# Patient Record
Sex: Male | Born: 1935 | Race: White | Hispanic: No | Marital: Married | Smoking: Never smoker
Health system: Southern US, Academic
[De-identification: ages and names within clinical notes are randomized; demographics above are authoritative.]

## PROBLEM LIST (undated history)

## (undated) ENCOUNTER — Inpatient Hospital Stay (HOSPITAL_COMMUNITY): Payer: Medicare PPO | Admitting: PULMONARY DISEASE

## (undated) DIAGNOSIS — D649 Anemia, unspecified: Secondary | ICD-10-CM

## (undated) DIAGNOSIS — I10 Essential (primary) hypertension: Secondary | ICD-10-CM

## (undated) DIAGNOSIS — E162 Hypoglycemia, unspecified: Secondary | ICD-10-CM

## (undated) DIAGNOSIS — E119 Type 2 diabetes mellitus without complications: Secondary | ICD-10-CM

## (undated) DIAGNOSIS — E785 Hyperlipidemia, unspecified: Secondary | ICD-10-CM

## (undated) DIAGNOSIS — I499 Cardiac arrhythmia, unspecified: Secondary | ICD-10-CM

## (undated) DIAGNOSIS — N183 Chronic kidney disease, stage 3 unspecified (CMS HCC): Secondary | ICD-10-CM

## (undated) DIAGNOSIS — I2699 Other pulmonary embolism without acute cor pulmonale: Secondary | ICD-10-CM

## (undated) DIAGNOSIS — I480 Paroxysmal atrial fibrillation: Secondary | ICD-10-CM

## (undated) DIAGNOSIS — R0989 Other specified symptoms and signs involving the circulatory and respiratory systems: Secondary | ICD-10-CM

## (undated) DIAGNOSIS — I255 Ischemic cardiomyopathy: Secondary | ICD-10-CM

## (undated) DIAGNOSIS — R0602 Shortness of breath: Secondary | ICD-10-CM

## (undated) DIAGNOSIS — I2510 Atherosclerotic heart disease of native coronary artery without angina pectoris: Secondary | ICD-10-CM

## (undated) DIAGNOSIS — I509 Heart failure, unspecified: Secondary | ICD-10-CM

## (undated) DIAGNOSIS — Z952 Presence of prosthetic heart valve: Secondary | ICD-10-CM

## (undated) DIAGNOSIS — I350 Nonrheumatic aortic (valve) stenosis: Secondary | ICD-10-CM

## (undated) HISTORY — DX: Hyperlipidemia, unspecified: E78.5

## (undated) HISTORY — DX: Ischemic cardiomyopathy: I25.5

## (undated) HISTORY — DX: Chronic kidney disease, stage 3 unspecified (CMS HCC): N18.30

## (undated) HISTORY — DX: Other pulmonary embolism without acute cor pulmonale (CMS HCC): I26.99

## (undated) HISTORY — DX: Heart failure, unspecified (CMS HCC): I50.9

## (undated) HISTORY — DX: Essential (primary) hypertension: I10

## (undated) HISTORY — PX: HX CORONARY STENT PLACEMENT: SHX49

## (undated) HISTORY — DX: Atherosclerotic heart disease of native coronary artery without angina pectoris: I25.10

## (undated) HISTORY — DX: Presence of prosthetic heart valve: Z95.2

## (undated) HISTORY — DX: Other specified symptoms and signs involving the circulatory and respiratory systems: R09.89

## (undated) HISTORY — DX: Paroxysmal atrial fibrillation (CMS HCC): I48.0

## (undated) HISTORY — DX: Nonrheumatic aortic (valve) stenosis: I35.0

## (undated) HISTORY — DX: Shortness of breath: R06.02

## (undated) HISTORY — PX: SKIN CANCER EXCISION: SHX779

---

## 2008-07-31 ENCOUNTER — Ambulatory Visit: Payer: Self-pay | Admitting: Gastroenterology

## 2009-10-23 ENCOUNTER — Ambulatory Visit (HOSPITAL_COMMUNITY): Payer: Self-pay | Admitting: PULMONARY DISEASE

## 2010-03-06 ENCOUNTER — Ambulatory Visit: Payer: Self-pay | Admitting: Internal Medicine

## 2010-03-13 ENCOUNTER — Ambulatory Visit: Payer: Self-pay | Admitting: Internal Medicine

## 2010-04-05 ENCOUNTER — Ambulatory Visit: Payer: Self-pay | Admitting: Internal Medicine

## 2011-01-08 ENCOUNTER — Ambulatory Visit: Payer: Self-pay | Admitting: Internal Medicine

## 2011-02-04 ENCOUNTER — Ambulatory Visit: Payer: Self-pay | Admitting: Internal Medicine

## 2011-05-14 ENCOUNTER — Ambulatory Visit: Payer: Self-pay | Admitting: Internal Medicine

## 2011-06-06 ENCOUNTER — Ambulatory Visit: Payer: Self-pay | Admitting: Internal Medicine

## 2011-09-10 ENCOUNTER — Ambulatory Visit: Payer: Self-pay | Admitting: Internal Medicine

## 2011-09-10 LAB — CBC CANCER CENTER
Basophil #: 0 x10 3/mm (ref 0.0–0.1)
Basophil %: 0.7 %
Eosinophil #: 0.1 x10 3/mm (ref 0.0–0.7)
HCT: 40.2 % (ref 40.0–52.0)
Lymphocyte #: 1.1 x10 3/mm (ref 1.0–3.6)
Lymphocyte %: 33.1 %
MCH: 31.4 pg (ref 26.0–34.0)
MCHC: 32.5 g/dL (ref 32.0–36.0)
Monocyte #: 0.5 x10 3/mm (ref 0.0–0.7)
Neutrophil #: 1.6 x10 3/mm (ref 1.4–6.5)
Neutrophil %: 49.4 %
RDW: 12.5 % (ref 11.5–14.5)
WBC: 3.3 x10 3/mm — ABNORMAL LOW (ref 3.8–10.6)

## 2011-10-05 ENCOUNTER — Ambulatory Visit: Payer: Self-pay | Admitting: Internal Medicine

## 2012-01-14 ENCOUNTER — Ambulatory Visit: Payer: Self-pay | Admitting: Internal Medicine

## 2012-01-14 LAB — FERRITIN: Ferritin (ARMC): 166 ng/mL (ref 8–388)

## 2012-01-14 LAB — CBC CANCER CENTER
Basophil %: 0.7 %
Eosinophil #: 0.1 x10 3/mm (ref 0.0–0.7)
HCT: 39 % — ABNORMAL LOW (ref 40.0–52.0)
HGB: 13 g/dL (ref 13.0–18.0)
Lymphocyte %: 31.9 %
MCH: 32.3 pg (ref 26.0–34.0)
MCHC: 33.3 g/dL (ref 32.0–36.0)
Monocyte #: 0.4 x10 3/mm (ref 0.2–1.0)
Monocyte %: 12.1 %
Neutrophil %: 52.2 %
Platelet: 150 x10 3/mm (ref 150–440)
RBC: 4.03 10*6/uL — ABNORMAL LOW (ref 4.40–5.90)

## 2012-01-14 LAB — RETICULOCYTES: Absolute Retic Count: 0.0445 10*6/uL (ref 0.024–0.084)

## 2012-02-04 ENCOUNTER — Ambulatory Visit: Payer: Self-pay | Admitting: Internal Medicine

## 2012-12-28 ENCOUNTER — Emergency Department: Payer: Self-pay | Admitting: Emergency Medicine

## 2012-12-28 LAB — BASIC METABOLIC PANEL
Anion Gap: 6 — ABNORMAL LOW (ref 7–16)
Co2: 26 mmol/L (ref 21–32)
EGFR (African American): 45 — ABNORMAL LOW
EGFR (Non-African Amer.): 39 — ABNORMAL LOW
Glucose: 120 mg/dL — ABNORMAL HIGH (ref 65–99)
Osmolality: 284 (ref 275–301)
Potassium: 4.4 mmol/L (ref 3.5–5.1)
Sodium: 141 mmol/L (ref 136–145)

## 2012-12-28 LAB — URINALYSIS, COMPLETE
Blood: NEGATIVE
Ketone: NEGATIVE
Leukocyte Esterase: NEGATIVE
Nitrite: NEGATIVE
Ph: 5 (ref 4.5–8.0)
RBC,UR: <1 /HPF (ref 0–5)
Specific Gravity: 1.015 (ref 1.003–1.030)
Squamous Epithelial: NONE SEEN

## 2012-12-28 LAB — CBC
HGB: 12.7 g/dL — ABNORMAL LOW (ref 13.0–18.0)
MCH: 32.4 pg (ref 26.0–34.0)
MCHC: 33.9 g/dL (ref 32.0–36.0)
MCV: 96 fL (ref 80–100)
RBC: 3.92 10*6/uL — ABNORMAL LOW (ref 4.40–5.90)
RDW: 13.3 % (ref 11.5–14.5)

## 2012-12-28 LAB — TROPONIN I: Troponin-I: <0.02 ng/mL

## 2013-02-07 ENCOUNTER — Ambulatory Visit: Payer: Self-pay | Admitting: Nephrology

## 2013-08-26 IMAGING — CT CT ABDOMEN W/O CM
1 of 2 series · 14 of 32 positions shown, 18 images · non-contrast
Comparison: none

REASON FOR EXAM: cyst  LT renal mass
COMMENTS:

[Series 2: soft tissue · axial · 0.78mm/px · z∈[-371,-116]mm · 14 of 95 slices shown, 18 images]
[im 5/95  soft-tissue]
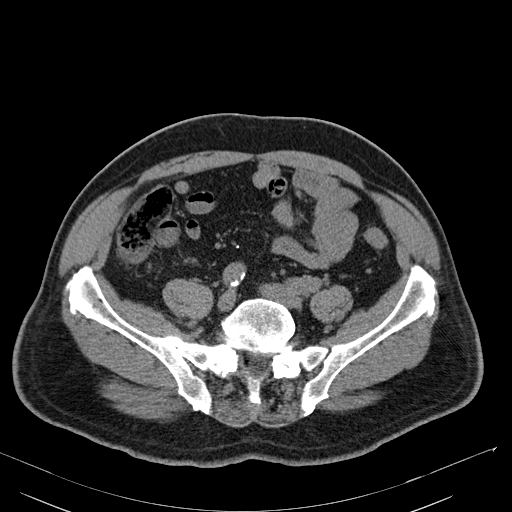
[im 5/95  bone]
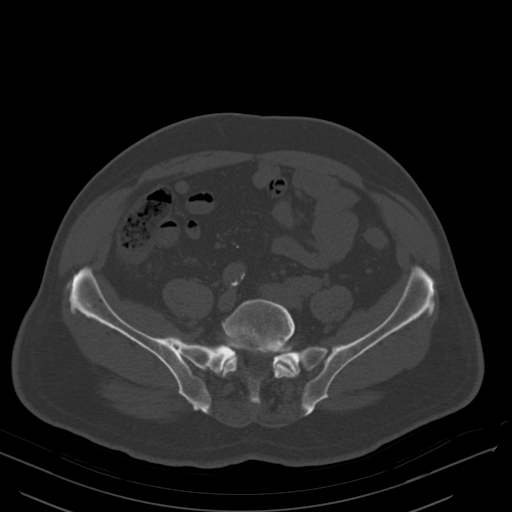
[im 13/95  soft-tissue]
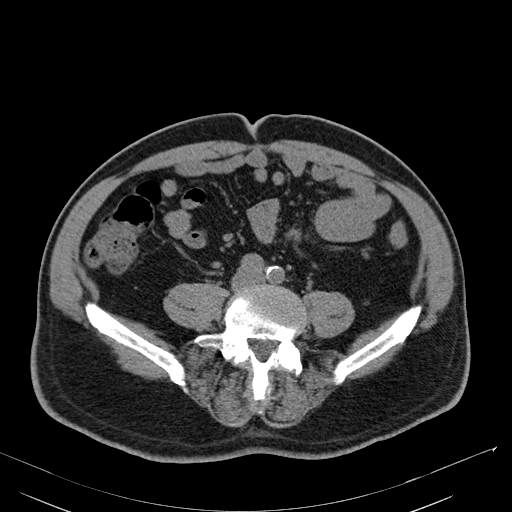
[im 22/95  soft-tissue]
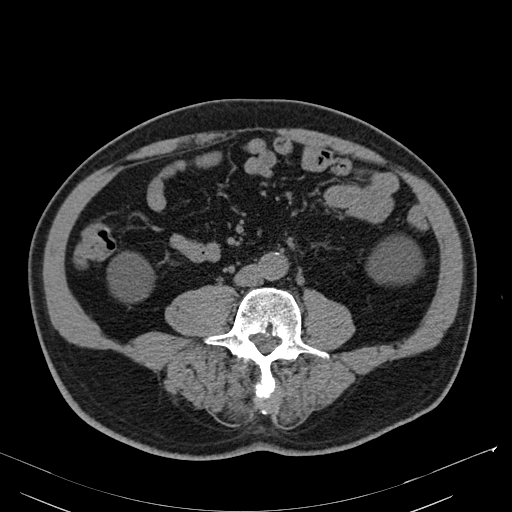
[im 30/95  soft-tissue]
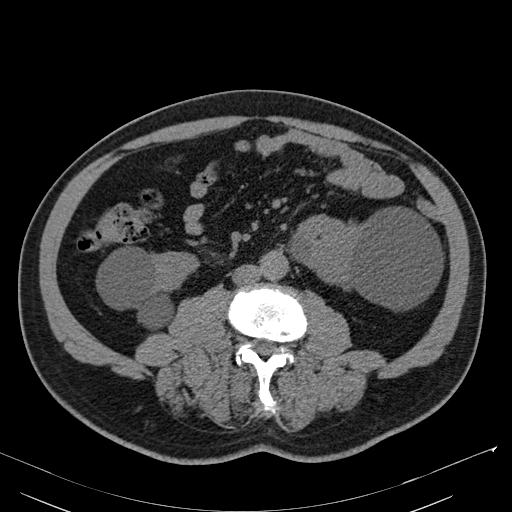
[im 35/95  soft-tissue]
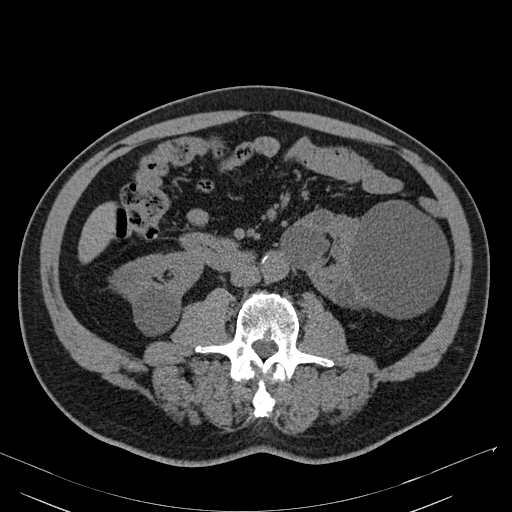
[im 43/95  soft-tissue]
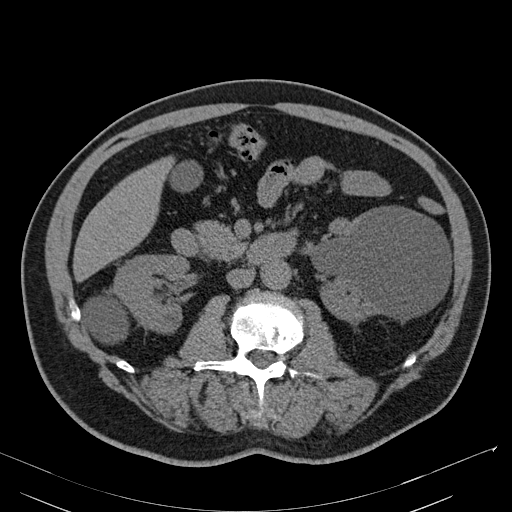
[im 52/95  soft-tissue]
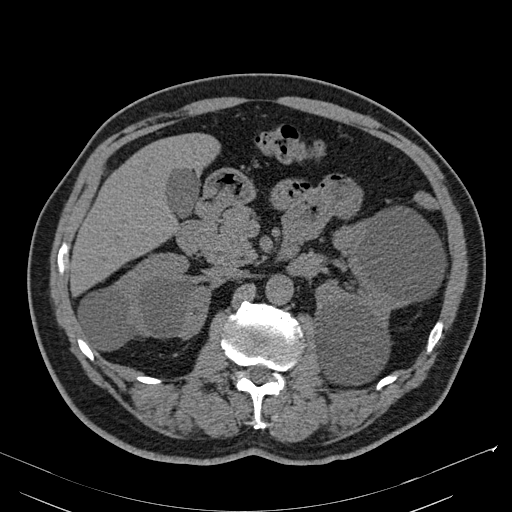
[im 60/95  soft-tissue]
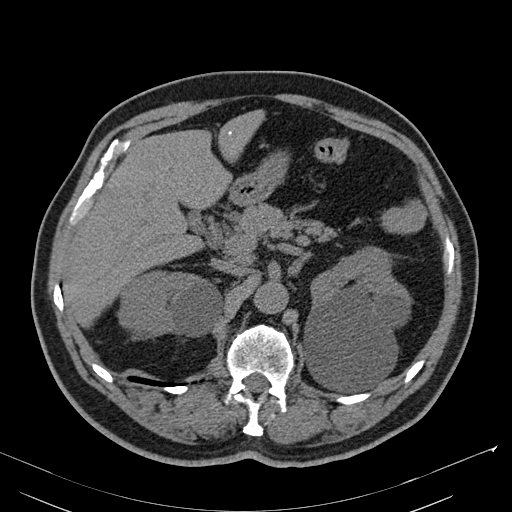
[im 65/95  soft-tissue]
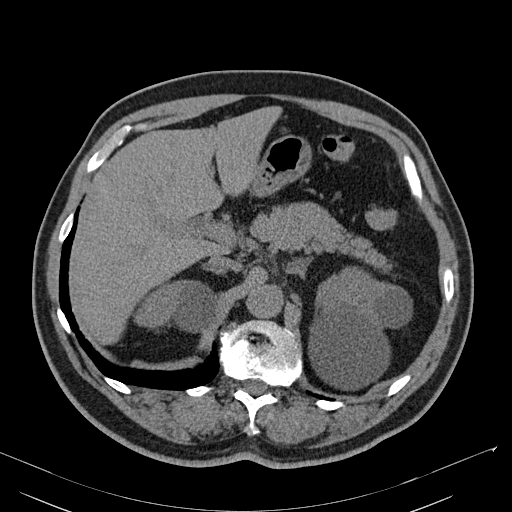
[im 65/95  bone]
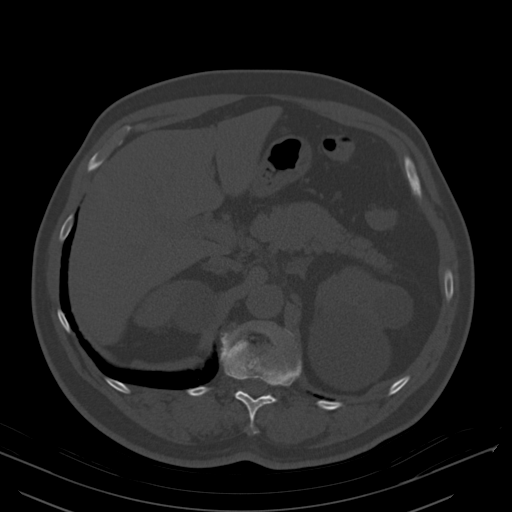
[im 73/95  soft-tissue]
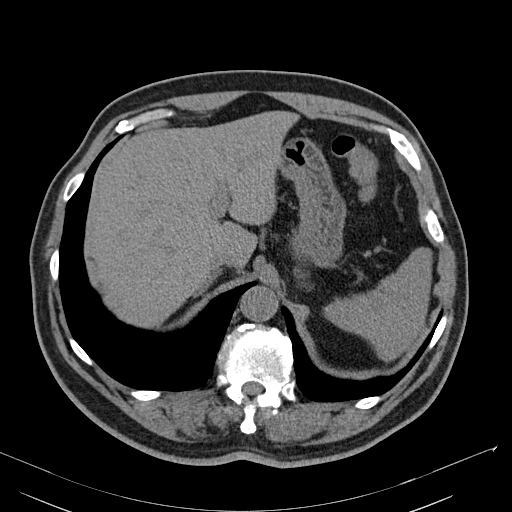
[im 77/95  lung]
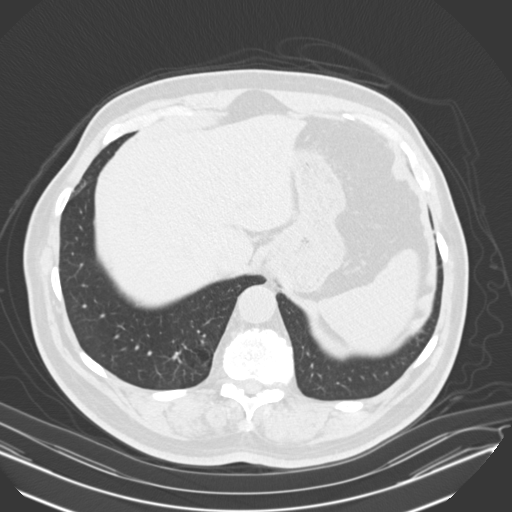
[im 82/95  soft-tissue]
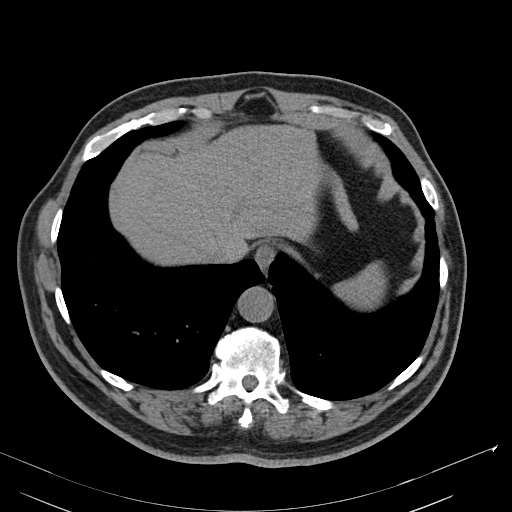
[im 82/95  lung]
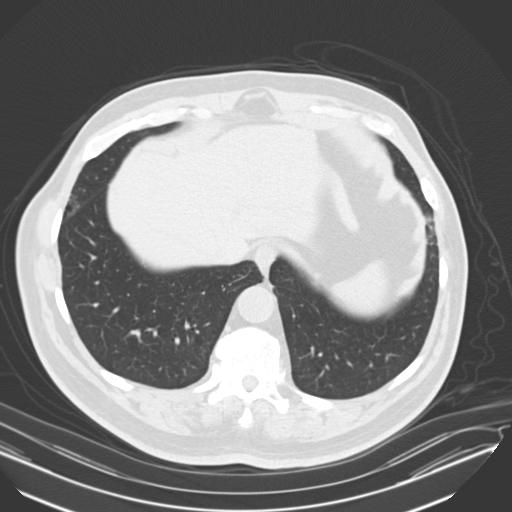
[im 86/95  lung]
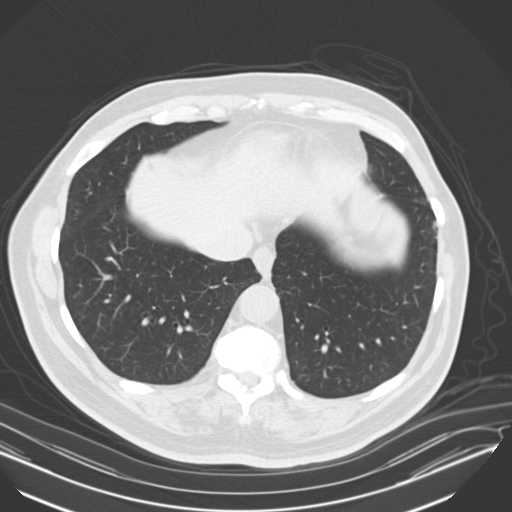
[im 90/95  soft-tissue]
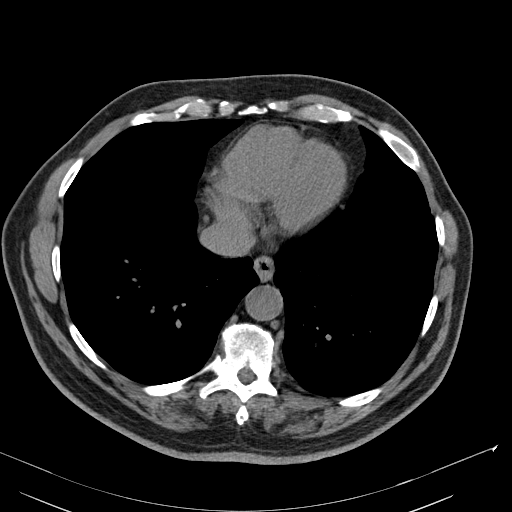
[im 90/95  lung]
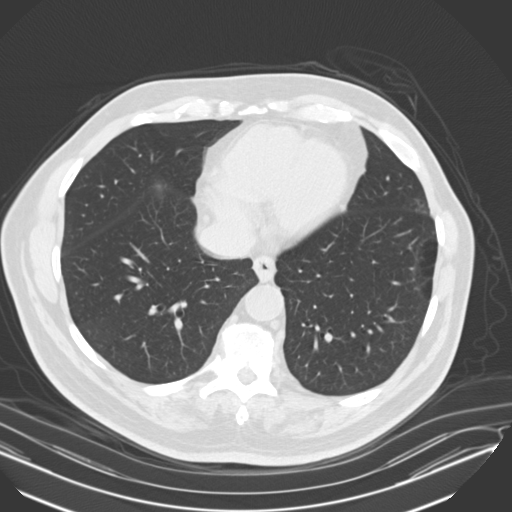

[14 of 32 positions shown; findings below may reference images not displayed]

PROCEDURE:     SEBAKILE - SEBAKILE ABDOMEN STANDARD WO  - February 07, 2013  [DATE]

RESULT:     Axial noncontrast CT scanning was performed through the abdomen
with reconstructions at 3 mm intervals and slice thicknesses. Review of
multiplanar reconstructed images was performed separately on the VIA monitor.

There are multiple fluid density structures associated with both kidneys.
These have Hounsfield measurements ranging from -2 to +3. No rim
calcification is demonstrated.

In the upper pole of the right kidney there is a 4.3 maximal dimension
presumed cyst. In the midpole laterally there is a 4.7 cm diameter cyst. In
the parapelvic region of the midpole there is a 4.6 cm diameter cyst. In the
lower pole posteriorly there is a 3.5 cm diameter cyst. More inferiorly in
the lower pole laterally there is a 5.2 cm diameter cystic-appearing
structure.

On the left in the upper pole posteriorly there is a presumed cyst measuring
8.3 cm in diameter. An adjacent 3.4 cm diameter similar appearing
hypodensity is demonstrated. In the midpole there is a 1.7 centimeter
diameter cystic structure as well as a 10.9 cm diameter cystic structure. In
the mid to lower pole anteriorly and posteriorly there are 2 cystic
structures measuring 1.1 and 1.8 cm in diameter. More medially a cystic
structure measuring 3.2 cm in diameter is present. Neither kidney exhibits
evidence of stones. No abnormal perinephric fluid collections are
demonstrated.

The liver and pancreas exhibit normal density with no cystic change. The
gallbladder is adequately distended. I cannot exclude tiny faintly
radiodense stones. The spleen correction the nondistended stomach is normal
in appearance. There is very mild fullness of the left adrenal gland. The
caliber of the abdominal aorta is normal. The unopacified loops of small and
large bowel are normal where visualized.

The lung bases exhibit no infiltrates. There are bullous lesions
bilaterally. There is no pleural effusion.
IMPRESSION: 1. There are bilateral parenchymal hypodensities associated with both
kidneys compatible with cysts. The largest cystic structure on the right
measures approximately 5 cm and on the left approximately 10.9 cm.
Correlation with the outside ultrasound would be useful.
2. I cannot exclude tiny gallstones.
3. There emphysematous changes with bullous lesions at both lung bases.

[REDACTED]

## 2014-01-02 DIAGNOSIS — M542 Cervicalgia: Secondary | ICD-10-CM | POA: Insufficient documentation

## 2014-01-02 DIAGNOSIS — I10 Essential (primary) hypertension: Secondary | ICD-10-CM | POA: Insufficient documentation

## 2014-01-02 DIAGNOSIS — E7800 Pure hypercholesterolemia, unspecified: Secondary | ICD-10-CM | POA: Insufficient documentation

## 2014-01-02 DIAGNOSIS — D649 Anemia, unspecified: Secondary | ICD-10-CM | POA: Insufficient documentation

## 2014-01-11 ENCOUNTER — Ambulatory Visit: Payer: Self-pay | Admitting: Emergency Medicine

## 2014-01-25 ENCOUNTER — Emergency Department: Payer: Self-pay | Admitting: Emergency Medicine

## 2014-01-25 LAB — COMPREHENSIVE METABOLIC PANEL
ALBUMIN: 4.1 g/dL (ref 3.4–5.0)
ALK PHOS: 55 U/L
AST: 19 U/L (ref 15–37)
Anion Gap: 6 — ABNORMAL LOW (ref 7–16)
BILIRUBIN TOTAL: 0.4 mg/dL (ref 0.2–1.0)
BUN: 15 mg/dL (ref 7–18)
Calcium, Total: 8.9 mg/dL (ref 8.5–10.1)
Chloride: 105 mmol/L (ref 98–107)
Co2: 28 mmol/L (ref 21–32)
Creatinine: 1.36 mg/dL — ABNORMAL HIGH (ref 0.60–1.30)
EGFR (African American): 56 — ABNORMAL LOW
GFR CALC NON AF AMER: 48 — AB
GLUCOSE: 94 mg/dL (ref 65–99)
Osmolality: 278 (ref 275–301)
POTASSIUM: 4.5 mmol/L (ref 3.5–5.1)
SGPT (ALT): 29 U/L
Sodium: 139 mmol/L (ref 136–145)
TOTAL PROTEIN: 7.7 g/dL (ref 6.4–8.2)

## 2014-01-25 LAB — URINALYSIS, COMPLETE
BACTERIA: NONE SEEN
Bilirubin,UR: NEGATIVE
GLUCOSE, UR: NEGATIVE mg/dL (ref 0–75)
Ketone: NEGATIVE
Leukocyte Esterase: NEGATIVE
NITRITE: NEGATIVE
PH: 5 (ref 4.5–8.0)
Protein: NEGATIVE
RBC, UR: NONE SEEN /HPF (ref 0–5)
SPECIFIC GRAVITY: 1.004 (ref 1.003–1.030)
Squamous Epithelial: NONE SEEN
WBC UR: <1 /HPF (ref 0–5)

## 2014-01-25 LAB — CBC
HCT: 40.5 % (ref 40.0–52.0)
HGB: 13.5 g/dL (ref 13.0–18.0)
MCH: 32.3 pg (ref 26.0–34.0)
MCHC: 33.3 g/dL (ref 32.0–36.0)
MCV: 97 fL (ref 80–100)
PLATELETS: 172 10*3/uL (ref 150–440)
RBC: 4.18 10*6/uL — ABNORMAL LOW (ref 4.40–5.90)
RDW: 12.6 % (ref 11.5–14.5)
WBC: 3.6 10*3/uL — AB (ref 3.8–10.6)

## 2014-01-25 LAB — CK TOTAL AND CKMB (NOT AT ARMC)
CK, TOTAL: 197 U/L
CK-MB: 1.8 ng/mL (ref 0.5–3.6)

## 2014-01-25 LAB — TROPONIN I

## 2014-02-01 DIAGNOSIS — E11649 Type 2 diabetes mellitus with hypoglycemia without coma: Secondary | ICD-10-CM | POA: Insufficient documentation

## 2014-02-01 DIAGNOSIS — R809 Proteinuria, unspecified: Secondary | ICD-10-CM | POA: Insufficient documentation

## 2014-05-01 ENCOUNTER — Ambulatory Visit: Payer: Self-pay | Admitting: Nephrology

## 2014-05-10 DIAGNOSIS — E559 Vitamin D deficiency, unspecified: Secondary | ICD-10-CM | POA: Insufficient documentation

## 2014-05-17 ENCOUNTER — Ambulatory Visit: Payer: Self-pay | Admitting: Nephrology

## 2014-05-29 ENCOUNTER — Ambulatory Visit: Payer: Self-pay | Admitting: Family Medicine

## 2015-03-01 ENCOUNTER — Encounter: Payer: Self-pay | Admitting: *Deleted

## 2015-03-04 ENCOUNTER — Encounter: Payer: Self-pay | Admitting: *Deleted

## 2015-03-04 ENCOUNTER — Ambulatory Visit
Admission: RE | Admit: 2015-03-04 | Discharge: 2015-03-04 | Disposition: A | Discharge status: Home Health | Payer: Medicare Other | Source: Ambulatory Visit | Attending: Gastroenterology | Admitting: Gastroenterology

## 2015-03-04 ENCOUNTER — Encounter
Admission: RE | Disposition: A | Discharge status: Home Health | Payer: Self-pay | Source: Ambulatory Visit | Attending: Gastroenterology

## 2015-03-04 ENCOUNTER — Ambulatory Visit: Payer: Medicare Other | Admitting: Anesthesiology

## 2015-03-04 DIAGNOSIS — K648 Other hemorrhoids: Secondary | ICD-10-CM | POA: Insufficient documentation

## 2015-03-04 DIAGNOSIS — Z87891 Personal history of nicotine dependence: Secondary | ICD-10-CM | POA: Diagnosis not present

## 2015-03-04 DIAGNOSIS — E785 Hyperlipidemia, unspecified: Secondary | ICD-10-CM | POA: Insufficient documentation

## 2015-03-04 DIAGNOSIS — D649 Anemia, unspecified: Secondary | ICD-10-CM | POA: Insufficient documentation

## 2015-03-04 DIAGNOSIS — K208 Other esophagitis: Secondary | ICD-10-CM | POA: Diagnosis not present

## 2015-03-04 DIAGNOSIS — D125 Benign neoplasm of sigmoid colon: Secondary | ICD-10-CM | POA: Insufficient documentation

## 2015-03-04 DIAGNOSIS — I10 Essential (primary) hypertension: Secondary | ICD-10-CM | POA: Diagnosis not present

## 2015-03-04 DIAGNOSIS — R195 Other fecal abnormalities: Secondary | ICD-10-CM | POA: Diagnosis present

## 2015-03-04 DIAGNOSIS — K2950 Unspecified chronic gastritis without bleeding: Secondary | ICD-10-CM | POA: Diagnosis not present

## 2015-03-04 DIAGNOSIS — K5900 Constipation, unspecified: Secondary | ICD-10-CM | POA: Diagnosis not present

## 2015-03-04 DIAGNOSIS — E119 Type 2 diabetes mellitus without complications: Secondary | ICD-10-CM | POA: Diagnosis not present

## 2015-03-04 HISTORY — DX: Type 2 diabetes mellitus without complications: E11.9

## 2015-03-04 HISTORY — PX: COLONOSCOPY WITH PROPOFOL: SHX5780

## 2015-03-04 HISTORY — PX: ESOPHAGOGASTRODUODENOSCOPY (EGD) WITH PROPOFOL: SHX5813

## 2015-03-04 HISTORY — DX: Hyperlipidemia, unspecified: E78.5

## 2015-03-04 HISTORY — DX: Essential (primary) hypertension: I10

## 2015-03-04 HISTORY — DX: Hypoglycemia, unspecified: E16.2

## 2015-03-04 HISTORY — DX: Anemia, unspecified: D64.9

## 2015-03-04 LAB — GLUCOSE, CAPILLARY: Glucose-Capillary: 106 mg/dL — ABNORMAL HIGH (ref 65–99)

## 2015-03-04 SURGERY — COLONOSCOPY WITH PROPOFOL
Anesthesia: General

## 2015-03-04 MED ORDER — SODIUM CHLORIDE 0.9 % IV SOLN
INTRAVENOUS | Status: DC
  Administered 2015-03-04: 16:00:00 via INTRAVENOUS

## 2015-03-04 MED ORDER — PHENYLEPHRINE HCL 10 MG/ML IJ SOLN
INTRAMUSCULAR | Status: DC | PRN
  Administered 2015-03-04 (×3): 100 ug via INTRAVENOUS

## 2015-03-04 MED ORDER — SODIUM CHLORIDE 0.9 % IV SOLN: INTRAVENOUS | Status: DC

## 2015-03-04 MED ORDER — MIDAZOLAM HCL 2 MG/2ML IJ SOLN
INTRAMUSCULAR | Status: DC | PRN
  Administered 2015-03-04: 1 mg via INTRAVENOUS

## 2015-03-04 MED ORDER — PROPOFOL INFUSION 10 MG/ML OPTIME
INTRAVENOUS | Status: DC | PRN
  Administered 2015-03-04: 120 ug/kg/min via INTRAVENOUS

## 2015-03-04 MED ORDER — GLYCOPYRROLATE 0.2 MG/ML IJ SOLN
INTRAMUSCULAR | Status: DC | PRN
  Administered 2015-03-04: .2 mg via INTRAVENOUS

## 2015-03-04 MED ORDER — EPHEDRINE SULFATE 50 MG/ML IJ SOLN
INTRAMUSCULAR | Status: DC | PRN
  Administered 2015-03-04: 10 mg via INTRAVENOUS

## 2015-03-04 MED ORDER — LIDOCAINE HCL (CARDIAC) 20 MG/ML IV SOLN
INTRAVENOUS | Status: DC | PRN
  Administered 2015-03-04: 100 mg via INTRAVENOUS

## 2015-03-04 MED ORDER — PROPOFOL 10 MG/ML IV BOLUS
INTRAVENOUS | Status: DC | PRN
  Administered 2015-03-04: 40 mg via INTRAVENOUS

## 2015-03-04 MED ORDER — SODIUM CHLORIDE 0.9 % IV SOLN
INTRAVENOUS | Status: DC
  Administered 2015-03-04: 14:00:00 via INTRAVENOUS

## 2015-03-04 NOTE — Transfer of Care (Signed)
Immediate Anesthesia Transfer of Care Note  Patient: Nathaniel Villanueva  Procedure(s) Performed: Procedure(s): COLONOSCOPY WITH PROPOFOL (N/A) ESOPHAGOGASTRODUODENOSCOPY (EGD) WITH PROPOFOL (N/A)  Patient Location: Endoscopy Unit  Anesthesia Type:General  Level of Consciousness: awake, alert , oriented and patient cooperative  Airway & Oxygen Therapy: Patient Spontanous Breathing and Patient connected to nasal cannula oxygen  Post-op Assessment: Report given to RN, Post -op Vital signs reviewed and stable and Patient moving all extremities X 4  Post vital signs: Reviewed and stable  Last Vitals:  Filed Vitals:   03/04/15 1636  BP: 110/73  Pulse: 66  Temp: 36.4 C  Resp: 16    Complications: No apparent anesthesia complications

## 2015-03-04 NOTE — Op Note (Signed)
[facility] Gastroenterology Patient Name: Nathaniel Villanueva Procedure Date: 03/04/2015 3:44 PM MRN: [id number] Account #: [id number] Date of Birth: [birth year] Admit Type: Outpatient Age: 81 Room: [facility] ENDO ROOM 2 Gender: Male Note Status: Finalized Procedure:         Colonoscopy Indications:       Heme positive stool Providers:         Lollie Sails, MD Referring MD:      Shirline Frees (Referring MD) Medicines:         Monitored Anesthesia Care Complications:     No immediate complications. Procedure:         Pre-Anesthesia Assessment:                    - ASA Grade Assessment: II - A patient with mild systemic                     disease.                    After obtaining informed consent, the colonoscope was                     passed under direct vision. Throughout the procedure, the                     patient's blood pressure, pulse, and oxygen saturations                     were monitored continuously. The Olympus PCF-H180AL                     colonoscope ( S#: [id number] ) was introduced through the                     anus and advanced to the the cecum, identified by                     appendiceal orifice and ileocecal valve. The colonoscopy                     was performed without difficulty. The patient tolerated                     the procedure well. The quality of the bowel preparation                     was fair. Findings:      A 1 mm polyp was found in the mid sigmoid colon. The polyp was sessile.       The polyp was removed with a cold biopsy forceps. Resection and       retrieval were complete.      Non-bleeding internal hemorrhoids were found during retroflexion. The       hemorrhoids were small.      The exam was otherwise without abnormality. Impression:        - One 1 mm polyp in the mid sigmoid colon. Resected and                     retrieved.                    - Non-bleeding internal hemorrhoids. Recommendation:    - Await  pathology results. Procedure Code(s): --- Professional ---  45380, Colonoscopy, flexible; with biopsy, single or                     multiple Diagnosis Code(s): --- Professional ---                    211.3, Benign neoplasm of colon                    455.0, Internal hemorrhoids without mention of complication                    792.1, Nonspecific abnormal findings in stool contents CPT copyright 2014 American [employer] Association. All rights reserved. The codes documented in this report are preliminary and upon coder review may  be revised to meet current compliance requirements. Lollie Sails, MD 03/04/2015 4:30:53 PM This report has been signed electronically. Number of Addenda: 0 Note Initiated On: 03/04/2015 3:44 PM Scope Withdrawal Time: 0 hours 11 minutes 49 seconds  Total Procedure Duration: 0 hours 19 minutes 4 seconds       [facility]

## 2015-03-04 NOTE — Anesthesia Preprocedure Evaluation (Signed)
Anesthesia Evaluation  Patient identified by MRN, date of birth, ID band Patient awake    Reviewed: Allergy & Precautions, NPO status , Patient's Chart, lab work & pertinent test results, reviewed documented beta blocker date and time   Airway Mallampati: III  TM Distance: >3 FB     Dental  (+) Chipped   Pulmonary former smoker,          Cardiovascular hypertension, Pt. on medications and Pt. on home beta blockers     Neuro/Psych    GI/Hepatic   Endo/Other  diabetes, Type 2  Renal/GU      Musculoskeletal   Abdominal   Peds  Hematology  (+) anemia ,   Anesthesia Other Findings   Reproductive/Obstetrics                             Anesthesia Physical Anesthesia Plan  ASA: II  Anesthesia Plan: General   Post-op Pain Management:    Induction:   Airway Management Planned: Nasal Cannula  Additional Equipment:   Intra-op Plan:   Post-operative Plan:   Informed Consent: I have reviewed the patients History and Physical, chart, labs and discussed the procedure including the risks, benefits and alternatives for the proposed anesthesia with the patient or authorized representative who has indicated his/her understanding and acceptance.     Plan Discussed with: CRNA  Anesthesia Plan Comments:         Anesthesia Quick Evaluation

## 2015-03-04 NOTE — H&P (Signed)
Outpatient short stay form Pre-procedure 03/04/2015 3:31 PM Lollie Sails MD  Primary Physician: Dr Sherrin Daisy  Reason for visit:  EGD and colonoscopy  History of present illness:  Hemoccult-positive stool. Patient is a 81 year old male presenting with a finding of Hemoccult-positive stool and screening. He had noticed a very dark stool several weeks ago and was checked by his primary physician. He has noted some change of bowel habits with some increasing constipation necessitating the use of a Dulcolax tablet.  Last colonoscopy was in 2010. He takes no anticoagulation medications were aspirin products    Current facility-administered medications:  .  0.9 %  sodium chloride infusion, , Intravenous, Continuous, Lollie Sails, MD, Last Rate: 10 mL/hr at 03/04/15 1405 .  0.9 %  sodium chloride infusion, , Intravenous, Continuous, Lollie Sails, MD  Prescriptions prior to admission  Medication Sig Dispense Refill Last Dose  . amLODipine (NORVASC) 10 MG tablet Take 10 mg by mouth daily.   Past Week at Unknown time  . doxazosin (CARDURA) 4 MG tablet Take 4 mg by mouth daily.   Past Week at Unknown time  . ergocalciferol (VITAMIN D2) 50000 UNITS capsule Take 50,000 Units by mouth once a week.   Past Month at Unknown time  . lisinopril (PRINIVIL,ZESTRIL) 20 MG tablet Take 20 mg by mouth daily.   03/03/2015 at Unknown time  . metFORMIN (GLUCOPHAGE) 500 MG tablet Take 500 mg by mouth 2 (two) times daily with a meal.   Past Month at Unknown time  . metoprolol succinate (TOPROL-XL) 100 MG 24 hr tablet Take 100 mg by mouth daily. Take with or immediately following a meal.   03/04/2015 at 0600     No Known Allergies   Past Medical History  Diagnosis Date  . Hypertension   . Diabetes mellitus without complication   . Hyperlipidemia   . Anemia   . Hypoglycemia     Review of systems:      Physical Exam    Heart and lungs: Regular rate and rhythm without rub or gallop, lungs  are bilaterally clear    HEENT: Normocephalic atraumatic eyes are anicteric    Other:     Pertinant exam for procedure: Soft nontender nondistended bowel sounds positive normoactive    Planned proceedures: EGD and colonoscopy with indicated procedures. I have discussed the risks benefits and complications of procedures to include not limited to bleeding, infection, perforation and the risk of sedation and the patient wishes to proceed.    Lollie Sails, MD Gastroenterology 03/04/2015  3:31 PM

## 2015-03-04 NOTE — Op Note (Signed)
[facility] Gastroenterology Patient Name: Nathaniel Villanueva Procedure Date: 03/04/2015 3:45 PM MRN: [id number] Account #: [id number] Date of Birth: January 12, 1931 Admit Type: Outpatient Age: 81 Room: [facility] ENDO ROOM 2 Gender: Male Note Status: Finalized Procedure:         Upper GI endoscopy Indications:       Heme positive stool Providers:         Lollie Sails, MD Referring MD:      Shirline Frees (Referring MD) Medicines:         Monitored Anesthesia Care Complications:     No immediate complications. Procedure:         Pre-Anesthesia Assessment:                    - ASA Grade Assessment: III - A patient with severe                     systemic disease.                    After obtaining informed consent, the endoscope was passed                     under direct vision. Throughout the procedure, the                     patient's blood pressure, pulse, and oxygen saturations                     were monitored continuously. The Endoscope was introduced                     through the mouth, and advanced to the fourth part of                     duodenum. The upper GI endoscopy was accomplished without                     difficulty. The patient tolerated the procedure well. Findings:      LA Grade C (one or more mucosal breaks continuous between tops of 2 or       more mucosal folds, less than 75% circumference) esophagitis with no       bleeding was found. Biopsies were taken with a cold forceps for       histology.      The exam of the esophagus was otherwise normal.      Patchy mild inflammation characterized by erosions marked by hematin       material, erythema and granularity was found in the gastric body, at the       incisura and in the gastric antrum. Biopsies were taken with a cold       forceps for Helicobacter pylori testing. Biopsies were taken with a cold       forceps for histology.      The cardia and gastric fundus were normal on retroflexion.     The examined duodenum was normal. Impression:        - LA Grade C erosive esophagitis. Biopsied.                    - Gastritis. Biopsied.                    - Normal examined duodenum. Recommendation:    - Await pathology results.                    -  Use Protonix (pantoprazole) 40 mg PO daily daily.                    - Return to GI clinic in 6 weeks. Procedure Code(s): --- Professional ---                    [phone], Esophagogastroduodenoscopy, flexible, transoral;                     with biopsy, single or multiple Diagnosis Code(s): --- Professional ---                    530.19, Other esophagitis                    535.50, Unspecified gastritis and gastroduodenitis,                     without mention of hemorrhage                    792.1, Nonspecific abnormal findings in stool contents CPT copyright 2014 American Medical Association. All rights reserved. The codes documented in this report are preliminary and upon coder review may  be revised to meet current compliance requirements. Lollie Sails, MD 03/04/2015 4:05:59 PM This report has been signed electronically. Number of Addenda: 0 Note Initiated On: 03/04/2015 3:45 PM      [facility]

## 2015-03-05 ENCOUNTER — Encounter: Payer: Self-pay | Admitting: Gastroenterology

## 2015-03-06 LAB — SURGICAL PATHOLOGY

## 2015-03-06 NOTE — Anesthesia Postprocedure Evaluation (Signed)
  Anesthesia Post-op Note  Patient: Nathaniel Villanueva  Procedure(s) Performed: Procedure(s): COLONOSCOPY WITH PROPOFOL (N/A) ESOPHAGOGASTRODUODENOSCOPY (EGD) WITH PROPOFOL (N/A)  Anesthesia type:General  Patient location: PACU  Post pain: Pain level controlled  Post assessment: Post-op Vital signs reviewed, Patient's Cardiovascular Status Stable, Respiratory Function Stable, Patent Airway and No signs of Nausea or vomiting  Post vital signs: Reviewed and stable  Last Vitals:  Filed Vitals:   03/04/15 1700  BP: 127/87  Pulse: 67  Temp:   Resp: 18    Level of consciousness: awake, alert  and patient cooperative  Complications: No apparent anesthesia complications

## 2015-04-05 ENCOUNTER — Encounter: Payer: Self-pay | Admitting: General Practice

## 2015-04-05 ENCOUNTER — Observation Stay
Admit: 2015-04-05 | Discharge: 2015-04-05 | Disposition: A | Discharge status: Home / Self Care | Payer: Medicare Other | Attending: Internal Medicine | Admitting: Internal Medicine

## 2015-04-05 ENCOUNTER — Emergency Department: Payer: Medicare Other

## 2015-04-05 ENCOUNTER — Observation Stay
Admission: EM | Admit: 2015-04-05 | Discharge: 2015-04-07 | Disposition: A | Discharge status: Home / Self Care | Payer: Medicare Other | Attending: Internal Medicine | Admitting: Internal Medicine

## 2015-04-05 DIAGNOSIS — Z23 Encounter for immunization: Secondary | ICD-10-CM | POA: Diagnosis not present

## 2015-04-05 DIAGNOSIS — I10 Essential (primary) hypertension: Secondary | ICD-10-CM | POA: Insufficient documentation

## 2015-04-05 DIAGNOSIS — M25512 Pain in left shoulder: Secondary | ICD-10-CM | POA: Diagnosis not present

## 2015-04-05 DIAGNOSIS — K219 Gastro-esophageal reflux disease without esophagitis: Secondary | ICD-10-CM | POA: Insufficient documentation

## 2015-04-05 DIAGNOSIS — M25511 Pain in right shoulder: Secondary | ICD-10-CM | POA: Diagnosis not present

## 2015-04-05 DIAGNOSIS — M5134 Other intervertebral disc degeneration, thoracic region: Secondary | ICD-10-CM | POA: Insufficient documentation

## 2015-04-05 DIAGNOSIS — E785 Hyperlipidemia, unspecified: Secondary | ICD-10-CM | POA: Insufficient documentation

## 2015-04-05 DIAGNOSIS — W19XXXA Unspecified fall, initial encounter: Secondary | ICD-10-CM | POA: Diagnosis not present

## 2015-04-05 DIAGNOSIS — R55 Syncope and collapse: Principal | ICD-10-CM | POA: Diagnosis present

## 2015-04-05 DIAGNOSIS — S022XXB Fracture of nasal bones, initial encounter for open fracture: Secondary | ICD-10-CM | POA: Diagnosis present

## 2015-04-05 DIAGNOSIS — S022XXA Fracture of nasal bones, initial encounter for closed fracture: Secondary | ICD-10-CM | POA: Diagnosis not present

## 2015-04-05 DIAGNOSIS — D649 Anemia, unspecified: Secondary | ICD-10-CM | POA: Diagnosis not present

## 2015-04-05 DIAGNOSIS — R509 Fever, unspecified: Secondary | ICD-10-CM | POA: Insufficient documentation

## 2015-04-05 DIAGNOSIS — Z87891 Personal history of nicotine dependence: Secondary | ICD-10-CM | POA: Diagnosis not present

## 2015-04-05 DIAGNOSIS — E119 Type 2 diabetes mellitus without complications: Secondary | ICD-10-CM | POA: Insufficient documentation

## 2015-04-05 DIAGNOSIS — S0121XA Laceration without foreign body of nose, initial encounter: Secondary | ICD-10-CM | POA: Diagnosis not present

## 2015-04-05 DIAGNOSIS — Z79899 Other long term (current) drug therapy: Secondary | ICD-10-CM | POA: Diagnosis not present

## 2015-04-05 DIAGNOSIS — M5030 Other cervical disc degeneration, unspecified cervical region: Secondary | ICD-10-CM | POA: Diagnosis not present

## 2015-04-05 LAB — GLUCOSE, CAPILLARY
GLUCOSE-CAPILLARY: 170 mg/dL — AB (ref 65–99)
Glucose-Capillary: 112 mg/dL — ABNORMAL HIGH (ref 65–99)

## 2015-04-05 LAB — CBC
HCT: 38.2 % — ABNORMAL LOW (ref 40.0–52.0)
HEMOGLOBIN: 12.6 g/dL — AB (ref 13.0–18.0)
MCH: 31.7 pg (ref 26.0–34.0)
MCHC: 33 g/dL (ref 32.0–36.0)
MCV: 96.2 fL (ref 80.0–100.0)
PLATELETS: 174 10*3/uL (ref 150–440)
RBC: 3.97 MIL/uL — AB (ref 4.40–5.90)
RDW: 13 % (ref 11.5–14.5)
WBC: 4.3 10*3/uL (ref 3.8–10.6)

## 2015-04-05 LAB — BASIC METABOLIC PANEL
ANION GAP: 10 (ref 5–15)
BUN: 18 mg/dL (ref 6–20)
CALCIUM: 9.2 mg/dL (ref 8.9–10.3)
CO2: 24 mmol/L (ref 22–32)
CREATININE: 1.63 mg/dL — AB (ref 0.61–1.24)
Chloride: 107 mmol/L (ref 101–111)
GFR, EST AFRICAN AMERICAN: 44 mL/min — AB (ref >60)
GFR, EST NON AFRICAN AMERICAN: 38 mL/min — AB (ref >60)
Glucose, Bld: 122 mg/dL — ABNORMAL HIGH (ref 65–99)
Potassium: 4.3 mmol/L (ref 3.5–5.1)
SODIUM: 141 mmol/L (ref 135–145)

## 2015-04-05 LAB — TROPONIN I: Troponin I: <0.03 ng/mL (ref <0.031)

## 2015-04-05 MED ORDER — SODIUM CHLORIDE 0.9 % IV SOLN
INTRAVENOUS | Status: DC
  Administered 2015-04-05 – 2015-04-06 (×2): via INTRAVENOUS

## 2015-04-05 MED ORDER — AMLODIPINE BESYLATE 10 MG PO TABS
10.0000 mg | ORAL_TABLET | Freq: Every day | ORAL | Status: DC
  Administered 2015-04-05 – 2015-04-07 (×3): 10 mg via ORAL
  Filled 2015-04-05 (×3): qty 1

## 2015-04-05 MED ORDER — ACETAMINOPHEN 650 MG RE SUPP: 650.0000 mg | Freq: Four times a day (QID) | RECTAL | Status: DC | PRN

## 2015-04-05 MED ORDER — PANTOPRAZOLE SODIUM 40 MG PO TBEC
40.0000 mg | DELAYED_RELEASE_TABLET | Freq: Every day | ORAL | Status: DC
  Administered 2015-04-05 – 2015-04-07 (×3): 40 mg via ORAL
  Filled 2015-04-05 (×3): qty 1

## 2015-04-05 MED ORDER — SODIUM CHLORIDE 0.9 % IJ SOLN
3.0000 mL | Freq: Two times a day (BID) | INTRAMUSCULAR | Status: DC
  Administered 2015-04-06: 3 mL via INTRAVENOUS

## 2015-04-05 MED ORDER — MORPHINE SULFATE (PF) 2 MG/ML IV SOLN
INTRAVENOUS | Status: AC
  Filled 2015-04-05: qty 1

## 2015-04-05 MED ORDER — LISINOPRIL 20 MG PO TABS
20.0000 mg | ORAL_TABLET | Freq: Every day | ORAL | Status: DC
  Administered 2015-04-05 – 2015-04-07 (×3): 20 mg via ORAL
  Filled 2015-04-05 (×3): qty 1

## 2015-04-05 MED ORDER — ONDANSETRON HCL 4 MG/2ML IJ SOLN
4.0000 mg | Freq: Once | INTRAMUSCULAR | Status: AC
Start: 2015-04-05 — End: 2015-04-05
  Administered 2015-04-05: 4 mg via INTRAVENOUS

## 2015-04-05 MED ORDER — MORPHINE SULFATE (PF) 2 MG/ML IV SOLN
INTRAVENOUS | Status: AC
  Administered 2015-04-05: 2 mg via INTRAVENOUS
  Filled 2015-04-05: qty 1

## 2015-04-05 MED ORDER — TETANUS-DIPHTH-ACELL PERTUSSIS 5-2.5-18.5 LF-MCG/0.5 IM SUSP
0.5000 mL | Freq: Once | INTRAMUSCULAR | Status: AC
  Administered 2015-04-05: 0.5 mL via INTRAMUSCULAR
  Filled 2015-04-05: qty 0.5

## 2015-04-05 MED ORDER — ONDANSETRON HCL 4 MG/2ML IJ SOLN: 4.0000 mg | Freq: Four times a day (QID) | INTRAMUSCULAR | Status: DC | PRN

## 2015-04-05 MED ORDER — ONDANSETRON HCL 4 MG/2ML IJ SOLN
INTRAMUSCULAR | Status: AC
  Filled 2015-04-05: qty 2

## 2015-04-05 MED ORDER — ACETAMINOPHEN 325 MG PO TABS: 650.0000 mg | ORAL_TABLET | Freq: Four times a day (QID) | ORAL | Status: DC | PRN

## 2015-04-05 MED ORDER — INFLUENZA VAC SPLIT QUAD 0.5 ML IM SUSY
0.5000 mL | PREFILLED_SYRINGE | INTRAMUSCULAR | Status: AC
  Administered 2015-04-06: 0.5 mL via INTRAMUSCULAR
  Filled 2015-04-05: qty 0.5

## 2015-04-05 MED ORDER — ONDANSETRON HCL 4 MG PO TABS: 4.0000 mg | ORAL_TABLET | Freq: Four times a day (QID) | ORAL | Status: DC | PRN

## 2015-04-05 MED ORDER — CEPHALEXIN 500 MG PO CAPS
ORAL_CAPSULE | ORAL | Status: AC
  Administered 2015-04-05: 500 mg via ORAL
  Filled 2015-04-05: qty 1

## 2015-04-05 MED ORDER — DOXAZOSIN MESYLATE 4 MG PO TABS
4.0000 mg | ORAL_TABLET | Freq: Every day | ORAL | Status: DC
  Administered 2015-04-05 – 2015-04-06 (×2): 4 mg via ORAL
  Filled 2015-04-05 (×3): qty 1

## 2015-04-05 MED ORDER — CEPHALEXIN 500 MG PO CAPS
500.0000 mg | ORAL_CAPSULE | Freq: Once | ORAL | Status: AC
  Administered 2015-04-05: 500 mg via ORAL

## 2015-04-05 MED ORDER — ENOXAPARIN SODIUM 40 MG/0.4ML ~~LOC~~ SOLN
40.0000 mg | SUBCUTANEOUS | Status: DC
  Administered 2015-04-05 – 2015-04-07 (×3): 40 mg via SUBCUTANEOUS
  Filled 2015-04-05 (×3): qty 0.4

## 2015-04-05 MED ORDER — HYDROCODONE-ACETAMINOPHEN 5-325 MG PO TABS
1.0000 | ORAL_TABLET | ORAL | Status: DC | PRN
  Administered 2015-04-05 (×3): 1 via ORAL
  Filled 2015-04-05 (×3): qty 1

## 2015-04-05 MED ORDER — ONDANSETRON HCL 4 MG/2ML IJ SOLN
INTRAMUSCULAR | Status: AC
  Administered 2015-04-05: 4 mg via INTRAVENOUS
  Filled 2015-04-05: qty 2

## 2015-04-05 MED ORDER — ALUM & MAG HYDROXIDE-SIMETH 200-200-20 MG/5ML PO SUSP: 30.0000 mL | Freq: Four times a day (QID) | ORAL | Status: DC | PRN

## 2015-04-05 MED ORDER — METOPROLOL SUCCINATE ER 100 MG PO TB24
100.0000 mg | ORAL_TABLET | Freq: Every day | ORAL | Status: DC
  Administered 2015-04-05 – 2015-04-07 (×3): 100 mg via ORAL
  Filled 2015-04-05 (×3): qty 1

## 2015-04-05 MED ORDER — MORPHINE SULFATE (PF) 2 MG/ML IV SOLN
2.0000 mg | Freq: Once | INTRAVENOUS | Status: AC
  Administered 2015-04-05: 2 mg via INTRAVENOUS

## 2015-04-05 MED ORDER — SENNOSIDES-DOCUSATE SODIUM 8.6-50 MG PO TABS
1.0000 | ORAL_TABLET | Freq: Every evening | ORAL | Status: DC | PRN
  Administered 2015-04-05 (×2): 1 via ORAL
  Filled 2015-04-05 (×2): qty 1

## 2015-04-05 MED ORDER — VITAMIN D (ERGOCALCIFEROL) 1.25 MG (50000 UNIT) PO CAPS
50000.0000 [IU] | ORAL_CAPSULE | ORAL | Status: DC
  Administered 2015-04-06: 50000 [IU] via ORAL
  Filled 2015-04-05: qty 1

## 2015-04-05 NOTE — Progress Notes (Signed)
   04/05/15 1125  Clinical Encounter Type  Visited With Patient and family together  Visit Type Initial  Referral From Patient  Consult/Referral To Chaplain  Spiritual Encounters  Spiritual Needs Prayer  Stress Factors  Patient Stress Factors Health changes  Chaplain prayed and offered emotional support to patient and family.   Chaplain Brianna Headen [phone]

## 2015-04-05 NOTE — Progress Notes (Signed)
*  PRELIMINARY RESULTS* Echocardiogram 2D Echocardiogram has been performed.  Nathaniel Villanueva 04/05/2015, 7:34 PM

## 2015-04-05 NOTE — ED Notes (Signed)
Pt to ED c/o fall at home, pt fell forward. Pt presents with a laceration to bridge of nose and c/o shoulder pain bilaterally.

## 2015-04-05 NOTE — H&P (Signed)
[facility] NAME: Nathaniel Villanueva    MR#:  [id number]  DATE OF BIRTH:  1931/04/23  DATE OF ADMISSION:  04/05/2015  PRIMARY CARE PHYSICIAN: Sherrin Daisy, MD   REQUESTING/REFERRING PHYSICIAN: Dr Cinda Quest  CHIEF COMPLAINT:  Syncope and fall HISTORY OF PRESENT ILLNESS:  Nathaniel Villanueva  is a 81 y.o. male with a known history of hypertension, diabetes and hyperlipidemia who presents with above complaint. Patient reports that he he was using the bathroom and on his way to his bed when he fell. He says he did lose consciousness. He had no symptoms prior to the episode. He was brought to the emergency room via EMS and found to have a bilateral nasal fracture. He has been in his usual state of health. He denies chest pain or seizure-like activity.  PAST MEDICAL HISTORY:   Past Medical History  Diagnosis Date  . Hypertension   . Diabetes mellitus without complication   . Hyperlipidemia   . Anemia   . Hypoglycemia     PAST SURGICAL HISTORY:   Past Surgical History  Procedure Laterality Date  . Colonoscopy with propofol N/A 03/04/2015    Procedure: COLONOSCOPY WITH PROPOFOL;  Surgeon: Lollie Sails, MD;  Location: [facility] ENDOSCOPY;  Service: Endoscopy;  Laterality: N/A;  . Esophagogastroduodenoscopy (egd) with propofol N/A 03/04/2015    Procedure: ESOPHAGOGASTRODUODENOSCOPY (EGD) WITH PROPOFOL;  Surgeon: Lollie Sails, MD;  Location: [facility] ENDOSCOPY;  Service: Endoscopy;  Laterality: N/A;    SOCIAL HISTORY:   Social History  Substance Use Topics  . Smoking status: Former Research scientist (life sciences)  . Smokeless tobacco: Never Used  . Alcohol Use: No    FAMILY HISTORY:  He does not recall family history. His father died when he was 43 years old.  DRUG ALLERGIES:  No Known Allergies   REVIEW OF SYSTEMS:  CONSTITUTIONAL: No fever, fatigue or weakness.  EYES: No blurred or double vision.  EARS, NOSE, AND THROAT: No tinnitus or ear pain.   RESPIRATORY: No cough, shortness of breath, wheezing or hemoptysis.  CARDIOVASCULAR: No chest pain, orthopnea, edema.  GASTROINTESTINAL: No nausea, vomiting, diarrhea or abdominal pain.  GENITOURINARY: No dysuria, hematuria.  ENDOCRINE: No polyuria, nocturia,  HEMATOLOGY: No anemia, easy bruising or bleeding SKIN: No rash or lesion. MUSCULOSKELETAL: No joint pain or arthritis.   NEUROLOGIC: No tingling, numbness, weakness.  PSYCHIATRY: No anxiety or depression.   MEDICATIONS AT HOME:   Prior to Admission medications   Medication Sig Start Date End Date Taking? Authorizing Provider  amLODipine (NORVASC) 10 MG tablet Take 10 mg by mouth daily.   Yes Historical Provider, MD  doxazosin (CARDURA) 4 MG tablet Take 4 mg by mouth daily.   Yes Historical Provider, MD  ergocalciferol (VITAMIN D2) 50000 UNITS capsule Take 50,000 Units by mouth once a week.   Yes Historical Provider, MD  lisinopril (PRINIVIL,ZESTRIL) 20 MG tablet Take 20 mg by mouth daily.   Yes Historical Provider, MD  metoprolol succinate (TOPROL-XL) 100 MG 24 hr tablet Take 100 mg by mouth daily. Take with or immediately following a meal.   Yes Historical Provider, MD  pantoprazole (PROTONIX) 40 MG tablet Take 40 mg by mouth daily.   Yes Historical Provider, MD      VITAL SIGNS:  Blood pressure 163/83, pulse 65, temperature 98.6 F (37 C), temperature source Oral, resp. rate 20, height 6' (1.829 m), weight 104 kg (229 lb 4.5 oz), SpO2 99 %.  PHYSICAL EXAMINATION:  GENERAL:  81 y.o.-year-old patient lying in the bed with no acute distress.  EYES: Pupils equal, round, reactive to light and accommodation. No scleral icterus. Extraocular muscles intact.  HEENT: Head atraumatic, normocephalic. Oropharynx and nasopharynx clear.  NECK:  Supple, no jugular venous distention. No thyroid enlargement, no tenderness.  LUNGS: Normal breath sounds bilaterally, no wheezing, rales,rhonchi or crepitation. No use of accessory muscles of  respiration.  CARDIOVASCULAR: S1, S2 normal. No murmurs, rubs, or gallops.  ABDOMEN: Soft, nontender, nondistended. Bowel sounds present. No organomegaly or mass.  EXTREMITIES: No pedal edema, cyanosis, or clubbing.  NEUROLOGIC: Cranial nerves II through XII are grossly intact. No focal deficits. PSYCHIATRIC: The patient is alert and oriented x 3.  SKIN: No obvious rash, lesion, or ulcer.   LABORATORY PANEL:   CBC  Recent Labs Lab 04/05/15 0640  WBC 4.3  HGB 12.6*  HCT 38.2*  PLT 174   ------------------------------------------------------------------------------------------------------------------  Chemistries   Recent Labs Lab 04/05/15 0640  NA 141  K 4.3  CL 107  CO2 24  GLUCOSE 122*  BUN 18  CREATININE 1.63*  CALCIUM 9.2   ------------------------------------------------------------------------------------------------------------------  Cardiac Enzymes  Recent Labs Lab 04/05/15 0856  TROPONINI <0.03   ------------------------------------------------------------------------------------------------------------------  RADIOLOGY:  Dg Chest 2 View  04/05/2015   CLINICAL DATA:  Fall, upper back pain  EXAM: CHEST  2 VIEW  COMPARISON:  12/28/2012  FINDINGS: The heart size and mediastinal contours are within normal limits. Both lungs are clear. The visualized skeletal structures are unremarkable. Left basilar bulla is stable. Minimal inferior endplate depression of U88 is stable.  IMPRESSION: No active cardiopulmonary disease.   Electronically Signed   By: Conchita Paris M.D.   On: 04/05/2015 09:01   Dg Thoracic Spine 2 View  04/05/2015   CLINICAL DATA:  [place] today, does not remember fall, upper thoracic pain at T2-T3, hypertension, diabetes mellitus  EXAM: THORACIC SPINE 2 VIEWS  COMPARISON:  Chest radiographs 12/28/2012  FINDINGS: Twelve pairs of ribs.  Bones appear demineralized.  Multilevel disc space narrowing and endplate spur formation.  Visualized portions of  the posterior ribs appear intact.  Minimal chronic anterior height loss lower thoracic vertebra unchanged.  No definite acute fracture, subluxation or bone destruction.  Degenerative disc disease changes at visualized inferior cervical spine on swimmer's view.  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  Old minimal superior endplate compression deformity lower thoracic spine.  No definite acute abnormalities.   Electronically Signed   By: Lavonia Dana M.D.   On: 04/05/2015 09:02   Ct Head Wo Contrast  04/05/2015   CLINICAL DATA:  Fall, nasal laceration and bilateral shoulder pain  EXAM: CT HEAD WITHOUT CONTRAST  CT MAXILLOFACIAL WITHOUT CONTRAST  CT CERVICAL SPINE WITHOUT CONTRAST  TECHNIQUE: Multidetector CT imaging of the head, cervical spine, and maxillofacial structures were performed using the standard protocol without intravenous contrast. Multiplanar CT image reconstructions of the cervical spine and maxillofacial structures were also generated.  COMPARISON:  05/01/2014 head CT  FINDINGS: CT HEAD FINDINGS  Mild diffuse cortical volume loss with proportional ventricular prominence. No acute hemorrhage, infarct, or mass lesion is identified. No midline shift. No skull fracture. Please see dedicated report referable to facial bones below.  CT MAXILLOFACIAL FINDINGS  Bilateral mildly comminuted nasal bone fractures are identified with overlying soft tissue swelling. Partial opacification of the ethmoid and right maxillary sinus noted. Zygomatic arches are intact. Mandibular condyles are properly located. Orbits are unremarkable. Globes appear normal.  CT CERVICAL SPINE FINDINGS  C1 through the cervicothoracic junction is visualized in its entirety. No precervical soft tissue widening. Mild multilevel disc degenerative change is identified with mild neural foraminal narrowing spanning predominantly C4-C6. Vertebral body heights are maintained. No fracture or dislocation is  identified. Lung apices are clear.  IMPRESSION: No acute intracranial abnormality. Stable mild cortical volume loss.  Comminuted bilateral nondisplaced nasal bone fractures.  Mid cervical spine degenerative change without fracture or dislocation.   Electronically Signed   By: Conchita Paris M.D.   On: 04/05/2015 07:32   Ct Cervical Spine Wo Contrast  04/05/2015   CLINICAL DATA:  Fall, nasal laceration and bilateral shoulder pain  EXAM: CT HEAD WITHOUT CONTRAST  CT MAXILLOFACIAL WITHOUT CONTRAST  CT CERVICAL SPINE WITHOUT CONTRAST  TECHNIQUE: Multidetector CT imaging of the head, cervical spine, and maxillofacial structures were performed using the standard protocol without intravenous contrast. Multiplanar CT image reconstructions of the cervical spine and maxillofacial structures were also generated.  COMPARISON:  05/01/2014 head CT  FINDINGS: CT HEAD FINDINGS  Mild diffuse cortical volume loss with proportional ventricular prominence. No acute hemorrhage, infarct, or mass lesion is identified. No midline shift. No skull fracture. Please see dedicated report referable to facial bones below.  CT MAXILLOFACIAL FINDINGS  Bilateral mildly comminuted nasal bone fractures are identified with overlying soft tissue swelling. Partial opacification of the ethmoid and right maxillary sinus noted. Zygomatic arches are intact. Mandibular condyles are properly located. Orbits are unremarkable. Globes appear normal.  CT CERVICAL SPINE FINDINGS  C1 through the cervicothoracic junction is visualized in its entirety. No precervical soft tissue widening. Mild multilevel disc degenerative change is identified with mild neural foraminal narrowing spanning predominantly C4-C6. Vertebral body heights are maintained. No fracture or dislocation is identified. Lung apices are clear.  IMPRESSION: No acute intracranial abnormality. Stable mild cortical volume loss.  Comminuted bilateral nondisplaced nasal bone fractures.  Mid cervical  spine degenerative change without fracture or dislocation.   Electronically Signed   By: Conchita Paris M.D.   On: 04/05/2015 07:32   Ct Maxillofacial Wo Cm  04/05/2015   CLINICAL DATA:  Fall, nasal laceration and bilateral shoulder pain  EXAM: CT HEAD WITHOUT CONTRAST  CT MAXILLOFACIAL WITHOUT CONTRAST  CT CERVICAL SPINE WITHOUT CONTRAST  TECHNIQUE: Multidetector CT imaging of the head, cervical spine, and maxillofacial structures were performed using the standard protocol without intravenous contrast. Multiplanar CT image reconstructions of the cervical spine and maxillofacial structures were also generated.  COMPARISON:  05/01/2014 head CT  FINDINGS: CT HEAD FINDINGS  Mild diffuse cortical volume loss with proportional ventricular prominence. No acute hemorrhage, infarct, or mass lesion is identified. No midline shift. No skull fracture. Please see dedicated report referable to facial bones below.  CT MAXILLOFACIAL FINDINGS  Bilateral mildly comminuted nasal bone fractures are identified with overlying soft tissue swelling. Partial opacification of the ethmoid and right maxillary sinus noted. Zygomatic arches are intact. Mandibular condyles are properly located. Orbits are unremarkable. Globes appear normal.  CT CERVICAL SPINE FINDINGS  C1 through the cervicothoracic junction is visualized in its entirety. No precervical soft tissue widening. Mild multilevel disc degenerative change is identified with mild neural foraminal narrowing spanning predominantly C4-C6. Vertebral body heights are maintained. No fracture or dislocation is identified. Lung apices are clear.  IMPRESSION: No acute intracranial abnormality. Stable mild cortical volume loss.  Comminuted bilateral nondisplaced nasal bone fractures.  Mid cervical spine degenerative change without fracture or dislocation.   Electronically Signed   By: Roena Malady.D.  On: 04/05/2015 07:32    EKG:  Biventricular block. No ST elevation or  depression.  IMPRESSION AND PLAN:  81 year old male status post syncopal episode and subsequently had a fall with nondisplaced nasal bone fractures.  1. Syncope: Patient will be admitted to the hospital service. I will order 2-D echocardiogram and check orthostatics. Troponins 3 will be ordered as well.  2. Nasal bone fractures: Patient will need ENT follow-up in one week. ENT was called by the emergency room physician who recommended follow-up in 1 week.  3. Essential hypertension: Continue Norvasc, lisinopril, metoprolol and Cardura.    4. GERD: Continue PPI  All the records are reviewed and case discussed with ED provider. Management plans discussed with the patient and he is in agreement.  CODE STATUS: Full  TOTAL TIME TAKING CARE OF THIS PATIENT: 45 minutes.    MODY, SITAL M.D on 04/05/2015 at 11:38 AM  Between 7am to 6pm - Pager - [phone] After 6pm go to [email or web address] - password [email or web address] [facility]  [facility]  Office  [phone]  CC: Primary care physician; Sherrin Daisy, MD

## 2015-04-05 NOTE — Progress Notes (Signed)
   04/05/15 1710  Clinical Encounter Type  Visited With Patient and family together  Visit Type Initial  Referral From Nurse  Consult/Referral To Chaplain  Spiritual Encounters  Spiritual Needs Prayer  Stress Factors  Patient Stress Factors Health changes  Family Stress Factors Family relationships;Health changes  Met w/patient & family. Provided spiritual care and prayer. Chap. Danny G. Nobles, ext. [phone] 

## 2015-04-05 NOTE — ED Provider Notes (Signed)
____________________________________________  Time seen: Approximately 8:21 AM  I have reviewed the triage vital signs and the nursing notes.   HISTORY  Chief Complaint Fall and Shoulder Pain    HPI Nathaniel Villanueva is a 81 y.o. male patient reports he got up in middle of night last night went to the bathroom and coming back he just woke up on the floor he apparently fell. Patient reports pain in his nose with a slight cut on the bridge of his nose and bilateral shoulder pain. Is never passed out before. Denies any lightheadedness chest pain nausea or any other complaints.  he says he really doesn't know what happened.   Past Medical History  Diagnosis Date  . Hypertension   . Diabetes mellitus without complication   . Hyperlipidemia   . Anemia   . Hypoglycemia     There are no active problems to display for this patient.   Past Surgical History  Procedure Laterality Date  . Colonoscopy with propofol N/A 03/04/2015    Procedure: COLONOSCOPY WITH PROPOFOL;  Surgeon: Lollie Sails, MD;  Location: [facility] ENDOSCOPY;  Service: Endoscopy;  Laterality: N/A;  . Esophagogastroduodenoscopy (egd) with propofol N/A 03/04/2015    Procedure: ESOPHAGOGASTRODUODENOSCOPY (EGD) WITH PROPOFOL;  Surgeon: Lollie Sails, MD;  Location: [facility] ENDOSCOPY;  Service: Endoscopy;  Laterality: N/A;    Current Outpatient Rx  Name  Route  Sig  Dispense  Refill  . amLODipine (NORVASC) 10 MG tablet   Oral   Take 10 mg by mouth daily.         Marland Kitchen doxazosin (CARDURA) 4 MG tablet   Oral   Take 4 mg by mouth daily.         . ergocalciferol (VITAMIN D2) 50000 UNITS capsule   Oral   Take 50,000 Units by mouth once a week.         Marland Kitchen lisinopril (PRINIVIL,ZESTRIL) 20 MG tablet   Oral   Take 20 mg by mouth daily.         . metoprolol succinate (TOPROL-XL) 100 MG 24 hr tablet   Oral   Take 100 mg by mouth daily. Take with or immediately following a meal.         . pantoprazole (PROTONIX) 40  MG tablet   Oral   Take 40 mg by mouth daily.           Allergies Review of patient's allergies indicates no known allergies.  History reviewed. No pertinent family history.  Social History Social History  Substance Use Topics  . Smoking status: Former Research scientist (life sciences)  . Smokeless tobacco: Never Used  . Alcohol Use: No    Review of Systems Constitutional: No fever/chills Eyes: No visual changes. ENT: No sore throat. Cardiovascular: Denies chest pain. Respiratory: Denies shortness of breath. Gastrointestinal: No abdominal pain.  No nausea, no vomiting.  No diarrhea.  No constipation. Genitourinary: Negative for dysuria. Musculoskeletal: Negative for back pain. Skin: Negative for rash. Neurological: Negative for headaches, focal weakness or numbness.  10-point ROS otherwise negative.  ____________________________________________   PHYSICAL EXAM:  VITAL SIGNS: ED Triage Vitals  Enc Vitals Group     BP 04/05/15 0626 156/123 mmHg     Pulse Rate 04/05/15 0626 57     Resp 04/05/15 0626 18     Temp 04/05/15 0626 97.4 F (36.3 C)     Temp Source 04/05/15 0626 Oral     SpO2 04/05/15 0626 97 %     Weight 04/05/15 0626 229 lb  4.5 oz (104 kg)     Height 04/05/15 0626 6' (1.829 m)     Head Cir --      Peak Flow --      Pain Score 04/05/15 0627 5     Pain Loc --      Pain Edu? --      Excl. in [place]? --     Constitutional: Alert and oriented. Well appearing and in no acute distress. Eyes: Conjunctivae are normal. PERRL. EOMI. Head: Atraumatic. Except for a 3 mm laceration over his nose and some swelling there. Nose: No congestion/rhinnorhea. See above also there is no septal hematoma Mouth/Throat: Mucous membranes are moist.  Oropharynx non-erythematous. Neck: No stridor.No cervical spine tenderness to palpation there is tenderness at about T2 however it's very mild  Cardiovascular: Normal rate, regular rhythm. Grossly normal heart sounds.  Good peripheral  circulation. Respiratory: Normal respiratory effort.  No retractions. Lungs CTAB. Chest is nontender Gastrointestinal: Soft and nontender. No distention. No abdominal bruits. No CVA tenderness. Musculoskeletal: No lower extremity tenderness nor edema.  No joint effusions. Neurologic:  Normal speech and language. No gross focal neurologic deficits are appreciated. No gait instability. Cranial nerves II through XII are intact cerebellar finger-nose is normal there is no focal motor or sensory deficits Skin:  Skin is warm, dry and intact. No rash noted. Psychiatric: Mood and affect are normal. Speech and behavior are normal.  ____________________________________________   LABS (all labs ordered are listed, but only abnormal results are displayed)  Labs Reviewed  BASIC METABOLIC PANEL - Abnormal; Notable for the following:    Glucose, Bld 122 (*)    Creatinine, Ser 1.63 (*)    GFR calc non Af Amer 38 (*)    GFR calc Af Amer 44 (*)    All other components within normal limits  CBC - Abnormal; Notable for the following:    RBC 3.97 (*)    Hemoglobin 12.6 (*)    HCT 38.2 (*)    All other components within normal limits  TROPONIN I   ____________________________________________  EKG  EKG read and interpreted by me shows normal sinus rhythm there is at least one PAC 2 PACs artery right bundle branch block Loc left anterior hemiblock. Acute ST-T wave changes ____________________________________________  RADIOLOGY  CT of the head neck and maxillofacial area are normal except for a minimally displaced nasal bridge fracture radiology I reviewed the films as well ____________________________________________   PROCEDURES  Nose cleaned and then Dermabond applied to the cut his edges are well approximated patient tolerated this well  ____________________________________________   INITIAL IMPRESSION / ASSESSMENT AND PLAN / ED COURSE  Pertinent labs & imaging results that were available  during my care of the patient were reviewed by me and considered in my medical decision making (see chart for details).       ____________________________________________   FINAL CLINICAL IMPRESSION(S) / ED DIAGNOSES  Final diagnoses:  None    Diagnosis syncope  Nena Polio, MD 04/05/15 [phone]

## 2015-04-06 LAB — BASIC METABOLIC PANEL
Anion gap: 3 — ABNORMAL LOW (ref 5–15)
BUN: 14 mg/dL (ref 6–20)
CHLORIDE: 111 mmol/L (ref 101–111)
CO2: 26 mmol/L (ref 22–32)
CREATININE: 1.64 mg/dL — AB (ref 0.61–1.24)
Calcium: 8.5 mg/dL — ABNORMAL LOW (ref 8.9–10.3)
GFR calc Af Amer: 43 mL/min — ABNORMAL LOW (ref >60)
GFR, EST NON AFRICAN AMERICAN: 37 mL/min — AB (ref >60)
Glucose, Bld: 120 mg/dL — ABNORMAL HIGH (ref 65–99)
Potassium: 4.1 mmol/L (ref 3.5–5.1)
SODIUM: 140 mmol/L (ref 135–145)

## 2015-04-06 LAB — GLUCOSE, CAPILLARY
GLUCOSE-CAPILLARY: 117 mg/dL — AB (ref 65–99)
GLUCOSE-CAPILLARY: 93 mg/dL (ref 65–99)
GLUCOSE-CAPILLARY: 123 mg/dL — AB (ref 65–99)
Glucose-Capillary: 106 mg/dL — ABNORMAL HIGH (ref 65–99)

## 2015-04-06 LAB — URINALYSIS COMPLETE WITH MICROSCOPIC (ARMC ONLY)
Bacteria, UA: NONE SEEN
Bilirubin Urine: NEGATIVE
GLUCOSE, UA: 50 mg/dL — AB
Hgb urine dipstick: NEGATIVE
KETONES UR: NEGATIVE mg/dL
Leukocytes, UA: NEGATIVE
Nitrite: NEGATIVE
PROTEIN: NEGATIVE mg/dL
SQUAMOUS EPITHELIAL / LPF: NONE SEEN
Specific Gravity, Urine: 1.01 (ref 1.005–1.030)
pH: 6 (ref 5.0–8.0)

## 2015-04-06 LAB — CBC
HCT: 36.3 % — ABNORMAL LOW (ref 40.0–52.0)
Hemoglobin: 11.9 g/dL — ABNORMAL LOW (ref 13.0–18.0)
MCH: 31.9 pg (ref 26.0–34.0)
MCHC: 32.9 g/dL (ref 32.0–36.0)
MCV: 97 fL (ref 80.0–100.0)
PLATELETS: 156 10*3/uL (ref 150–440)
RBC: 3.74 MIL/uL — ABNORMAL LOW (ref 4.40–5.90)
RDW: 12.8 % (ref 11.5–14.5)
WBC: 5.9 10*3/uL (ref 3.8–10.6)

## 2015-04-06 MED ORDER — AMPICILLIN-SULBACTAM SODIUM 1.5 (1-0.5) G IJ SOLR
1.5000 g | Freq: Four times a day (QID) | INTRAMUSCULAR | Status: DC
  Administered 2015-04-06 – 2015-04-07 (×6): 1.5 g via INTRAVENOUS
  Filled 2015-04-06 (×10): qty 1.5

## 2015-04-06 MED ORDER — POLYETHYLENE GLYCOL 3350 17 G PO PACK
17.0000 g | PACK | Freq: Every day | ORAL | Status: DC
  Administered 2015-04-06 – 2015-04-07 (×2): 17 g via ORAL
  Filled 2015-04-06 (×2): qty 1

## 2015-04-06 NOTE — Progress Notes (Signed)
Called by Dr Cinda Quest after hours to sing out that per his conversation with ENT pt needs to be on abx for open nose fracture.  Unasyn ordered.  Jacqulyn Bath [facility] 04/06/2015, 3:45 AM

## 2015-04-06 NOTE — Progress Notes (Signed)
[name]    MR#:  [id number]  DATE OF BIRTH:  01-17-1931  SUBJECTIVE:  CHIEF COMPLAINT:   Chief Complaint  Patient presents with  . Fall  . Shoulder Pain    Came after syncopal episode, and fall. Had increased urine frequency for 1 day.  Michela Pitcher he feels fine now. Had Low grade fever this morning.  REVIEW OF SYSTEMS:  CONSTITUTIONAL: No fever, fatigue or weakness.  EYES: No blurred or double vision.  EARS, NOSE, AND THROAT: No tinnitus or ear pain.  RESPIRATORY: No cough, shortness of breath, wheezing or hemoptysis.  CARDIOVASCULAR: No chest pain, orthopnea, edema.  GASTROINTESTINAL: No nausea, vomiting, diarrhea or abdominal pain.  GENITOURINARY: No dysuria, hematuria. Had increased frequency. ENDOCRINE: No polyuria, nocturia,  HEMATOLOGY: No anemia, easy bruising or bleeding SKIN: No rash or lesion. MUSCULOSKELETAL: No joint pain or arthritis.   NEUROLOGIC: No tingling, numbness, weakness.  PSYCHIATRY: No anxiety or depression.   ROS  DRUG ALLERGIES:  No Known Allergies  VITALS:  Blood pressure 159/80, pulse 64, temperature 99.8 F (37.7 C), temperature source Oral, resp. rate 18, height 6' (1.829 m), weight 104 kg (229 lb 4.5 oz), SpO2 97 %.  PHYSICAL EXAMINATION:  GENERAL:  81 y.o.-year-old patient lying in the bed with no acute distress.  EYES: Pupils equal, round, reactive to light and accommodation. No scleral icterus. Extraocular muscles intact.  HEENT: Head atraumatic, normocephalic. Oropharynx and nasopharynx clear. Nasal bone have some contusion and dried blood. NECK:  Supple, no jugular venous distention. No thyroid enlargement, no tenderness.  LUNGS: Normal breath sounds bilaterally, no wheezing, rales,rhonchi or crepitation. No use of accessory muscles of respiration.  CARDIOVASCULAR: S1, S2 normal. No murmurs, rubs, or gallops.  ABDOMEN: Soft, nontender, nondistended. Bowel  sounds present. No organomegaly or mass.  EXTREMITIES: No pedal edema, cyanosis, or clubbing.  NEUROLOGIC: Cranial nerves II through XII are intact. Muscle strength 5/5 in all extremities. Sensation intact. Gait not checked.  PSYCHIATRIC: The patient is alert and oriented x 3.  SKIN: No obvious rash, lesion, or ulcer.   Physical Exam LABORATORY PANEL:   CBC  Recent Labs Lab 04/05/15 0640  WBC 4.3  HGB 12.6*  HCT 38.2*  PLT 174   ------------------------------------------------------------------------------------------------------------------  Chemistries   Recent Labs Lab 04/06/15 0400  NA 140  K 4.1  CL 111  CO2 26  GLUCOSE 120*  BUN 14  CREATININE 1.64*  CALCIUM 8.5*   ------------------------------------------------------------------------------------------------------------------  Cardiac Enzymes  Recent Labs Lab 04/05/15 1645 04/05/15 2254  TROPONINI <0.03 <0.03   ------------------------------------------------------------------------------------------------------------------  RADIOLOGY:  Dg Chest 2 View  04/05/2015   CLINICAL DATA:  Fall, upper back pain  EXAM: CHEST  2 VIEW  COMPARISON:  12/28/2012  FINDINGS: The heart size and mediastinal contours are within normal limits. Both lungs are clear. The visualized skeletal structures are unremarkable. Left basilar bulla is stable. Minimal inferior endplate depression of O17 is stable.  IMPRESSION: No active cardiopulmonary disease.   Electronically Signed   By: Conchita Paris M.D.   On: 04/05/2015 09:01   Dg Thoracic Spine 2 View  04/05/2015   CLINICAL DATA:  [place] today, does not remember fall, upper thoracic pain at T2-T3, hypertension, diabetes mellitus  EXAM: THORACIC SPINE 2 VIEWS  COMPARISON:  Chest radiographs 12/28/2012  FINDINGS: Twelve pairs of ribs.  Bones appear demineralized.  Multilevel disc space narrowing and endplate spur formation.  Visualized portions of the posterior ribs  appear intact.   Minimal chronic anterior height loss lower thoracic vertebra unchanged.  No definite acute fracture, subluxation or bone destruction.  Degenerative disc disease changes at visualized inferior cervical spine on swimmer's view.  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  Old minimal superior endplate compression deformity lower thoracic spine.  No definite acute abnormalities.   Electronically Signed   By: Lavonia Dana M.D.   On: 04/05/2015 09:02   Ct Head Wo Contrast  04/05/2015   CLINICAL DATA:  Fall, nasal laceration and bilateral shoulder pain  EXAM: CT HEAD WITHOUT CONTRAST  CT MAXILLOFACIAL WITHOUT CONTRAST  CT CERVICAL SPINE WITHOUT CONTRAST  TECHNIQUE: Multidetector CT imaging of the head, cervical spine, and maxillofacial structures were performed using the standard protocol without intravenous contrast. Multiplanar CT image reconstructions of the cervical spine and maxillofacial structures were also generated.  COMPARISON:  05/01/2014 head CT  FINDINGS: CT HEAD FINDINGS  Mild diffuse cortical volume loss with proportional ventricular prominence. No acute hemorrhage, infarct, or mass lesion is identified. No midline shift. No skull fracture. Please see dedicated report referable to facial bones below.  CT MAXILLOFACIAL FINDINGS  Bilateral mildly comminuted nasal bone fractures are identified with overlying soft tissue swelling. Partial opacification of the ethmoid and right maxillary sinus noted. Zygomatic arches are intact. Mandibular condyles are properly located. Orbits are unremarkable. Globes appear normal.  CT CERVICAL SPINE FINDINGS  C1 through the cervicothoracic junction is visualized in its entirety. No precervical soft tissue widening. Mild multilevel disc degenerative change is identified with mild neural foraminal narrowing spanning predominantly C4-C6. Vertebral body heights are maintained. No fracture or dislocation is identified. Lung apices are clear.   IMPRESSION: No acute intracranial abnormality. Stable mild cortical volume loss.  Comminuted bilateral nondisplaced nasal bone fractures.  Mid cervical spine degenerative change without fracture or dislocation.   Electronically Signed   By: Conchita Paris M.D.   On: 04/05/2015 07:32   Ct Cervical Spine Wo Contrast  04/05/2015   CLINICAL DATA:  Fall, nasal laceration and bilateral shoulder pain  EXAM: CT HEAD WITHOUT CONTRAST  CT MAXILLOFACIAL WITHOUT CONTRAST  CT CERVICAL SPINE WITHOUT CONTRAST  TECHNIQUE: Multidetector CT imaging of the head, cervical spine, and maxillofacial structures were performed using the standard protocol without intravenous contrast. Multiplanar CT image reconstructions of the cervical spine and maxillofacial structures were also generated.  COMPARISON:  05/01/2014 head CT  FINDINGS: CT HEAD FINDINGS  Mild diffuse cortical volume loss with proportional ventricular prominence. No acute hemorrhage, infarct, or mass lesion is identified. No midline shift. No skull fracture. Please see dedicated report referable to facial bones below.  CT MAXILLOFACIAL FINDINGS  Bilateral mildly comminuted nasal bone fractures are identified with overlying soft tissue swelling. Partial opacification of the ethmoid and right maxillary sinus noted. Zygomatic arches are intact. Mandibular condyles are properly located. Orbits are unremarkable. Globes appear normal.  CT CERVICAL SPINE FINDINGS  C1 through the cervicothoracic junction is visualized in its entirety. No precervical soft tissue widening. Mild multilevel disc degenerative change is identified with mild neural foraminal narrowing spanning predominantly C4-C6. Vertebral body heights are maintained. No fracture or dislocation is identified. Lung apices are clear.  IMPRESSION: No acute intracranial abnormality. Stable mild cortical volume loss.  Comminuted bilateral nondisplaced nasal bone fractures.  Mid cervical spine degenerative change without  fracture or dislocation.   Electronically Signed   By: Conchita Paris M.D.   On: 04/05/2015 07:32   Ct Maxillofacial Wo Cm  04/05/2015  CLINICAL DATA:  Fall, nasal laceration and bilateral shoulder pain  EXAM: CT HEAD WITHOUT CONTRAST  CT MAXILLOFACIAL WITHOUT CONTRAST  CT CERVICAL SPINE WITHOUT CONTRAST  TECHNIQUE: Multidetector CT imaging of the head, cervical spine, and maxillofacial structures were performed using the standard protocol without intravenous contrast. Multiplanar CT image reconstructions of the cervical spine and maxillofacial structures were also generated.  COMPARISON:  05/01/2014 head CT  FINDINGS: CT HEAD FINDINGS  Mild diffuse cortical volume loss with proportional ventricular prominence. No acute hemorrhage, infarct, or mass lesion is identified. No midline shift. No skull fracture. Please see dedicated report referable to facial bones below.  CT MAXILLOFACIAL FINDINGS  Bilateral mildly comminuted nasal bone fractures are identified with overlying soft tissue swelling. Partial opacification of the ethmoid and right maxillary sinus noted. Zygomatic arches are intact. Mandibular condyles are properly located. Orbits are unremarkable. Globes appear normal.  CT CERVICAL SPINE FINDINGS  C1 through the cervicothoracic junction is visualized in its entirety. No precervical soft tissue widening. Mild multilevel disc degenerative change is identified with mild neural foraminal narrowing spanning predominantly C4-C6. Vertebral body heights are maintained. No fracture or dislocation is identified. Lung apices are clear.  IMPRESSION: No acute intracranial abnormality. Stable mild cortical volume loss.  Comminuted bilateral nondisplaced nasal bone fractures.  Mid cervical spine degenerative change without fracture or dislocation.   Electronically Signed   By: Conchita Paris M.D.   On: 04/05/2015 07:32    ASSESSMENT AND PLAN:   Active Problems:   Syncope  81 year old male status post syncopal  episode and subsequently had a fall with nondisplaced nasal bone fractures.  1. Syncope:    Troponin remained negative.   Awaited echo result.    His Urinalysis was not checked, but he has c/o increased frequency- and now low grade fever.   Check UA.  2. Nasal bone fractures:   need ENT follow-up in one week.  ENT was called by the emergency room physician who recommended Abx for 1 week.   On Augmentin.  3. Essential hypertension: Continue Norvasc, lisinopril, metoprolol and Cardura.  4. GERD: Continue PPI   All the records are reviewed and case discussed with Care Management/Social Workerr. Management plans discussed with the patient, family and they are in agreement.  CODE STATUS: full  TOTAL TIME TAKING CARE OF THIS PATIENT: 35 minutes.     POSSIBLE D/C IN 1-2 DAYS, DEPENDING ON CLINICAL CONDITION.   Vaughan Basta M.D on 04/06/2015   Between 7am to 6pm - Pager - [phone]  After 6pm go to [email or web address] - password [email or web address] [facility]  Office  [phone]  CC: Primary care physician; Sherrin Daisy, MD  Note: This dictation was prepared with Dragon dictation along with smaller phrase technology. Any transcriptional errors that result from this process are unintentional.

## 2015-04-07 LAB — GLUCOSE, CAPILLARY
Glucose-Capillary: 92 mg/dL (ref 65–99)
Glucose-Capillary: 97 mg/dL (ref 65–99)

## 2015-04-07 MED ORDER — BISACODYL 10 MG RE SUPP
10.0000 mg | Freq: Once | RECTAL | Status: AC
  Administered 2015-04-07: 10 mg via RECTAL
  Filled 2015-04-07: qty 1

## 2015-04-07 MED ORDER — HYDROCODONE-ACETAMINOPHEN 5-325 MG PO TABS: 1.0000 | ORAL_TABLET | Freq: Four times a day (QID) | ORAL | Status: DC | PRN

## 2015-04-07 MED ORDER — POLYETHYLENE GLYCOL 3350 17 G PO PACK: 17.0000 g | PACK | Freq: Every day | ORAL | Status: DC

## 2015-04-07 MED ORDER — AMOXICILLIN-POT CLAVULANATE 875-125 MG PO TABS: 1.0000 | ORAL_TABLET | Freq: Two times a day (BID) | ORAL | Status: DC

## 2015-04-07 NOTE — Discharge Summary (Signed)
[facility] [place] NAME: [name]    MR#:  [id number]  DATE OF BIRTH:  1930/07/15  DATE OF ADMISSION:  04/05/2015 ADMITTING PHYSICIAN: Bettey Costa, MD  DATE OF DISCHARGE: 04/07/2015  PRIMARY CARE PHYSICIAN: Sherrin Daisy, MD    ADMISSION DIAGNOSIS:  Nasal fracture, open, initial encounter [S02.2XXB] Syncope, unspecified syncope type [R55]  DISCHARGE DIAGNOSIS:  Active Problems:   Syncope   SECONDARY DIAGNOSIS:   Past Medical History  Diagnosis Date  . Hypertension   . Diabetes mellitus without complication   . Hyperlipidemia   . Anemia   . Hypoglycemia     HOSPITAL COURSE:   81 year old male status post syncopal episode and subsequently had a fall with nondisplaced nasal bone fractures.  1. Syncope:   Troponin remained negative.  reviewed normal echo result.  His Urinalysis was checked, negative.   CT head and Xray chest negative.  2. Nasal bone fractures:  need ENT follow-up in one week. ENT was called by the emergency room physician who recommended Abx for 1 week.  On Augmentin.  3. Essential hypertension: Continue Norvasc, lisinopril, metoprolol and Cardura.  4. GERD: Continue PPI   DISCHARGE CONDITIONS:   Stable.  CONSULTS OBTAINED:     DRUG ALLERGIES:  No Known Allergies  DISCHARGE MEDICATIONS:   Current Discharge Medication List    START taking these medications   Details  amoxicillin-clavulanate (AUGMENTIN) 875-125 MG tablet Take 1 tablet by mouth 2 (two) times daily. Qty: 12 tablet, Refills: 0    HYDROcodone-acetaminophen (NORCO/VICODIN) 5-325 MG tablet Take 1 tablet by mouth every 6 (six) hours as needed for moderate pain. Qty: 20 tablet, Refills: 0    polyethylene glycol (MIRALAX / GLYCOLAX) packet Take 17 g by mouth daily. Qty: 14 each, Refills: 0      CONTINUE these medications which have NOT CHANGED   Details  amLODipine (NORVASC) 10 MG tablet Take 10 mg by  mouth daily.    doxazosin (CARDURA) 4 MG tablet Take 4 mg by mouth daily.    ergocalciferol (VITAMIN D2) 50000 UNITS capsule Take 50,000 Units by mouth once a week.    lisinopril (PRINIVIL,ZESTRIL) 20 MG tablet Take 20 mg by mouth daily.    metoprolol succinate (TOPROL-XL) 100 MG 24 hr tablet Take 100 mg by mouth daily. Take with or immediately following a meal.    pantoprazole (PROTONIX) 40 MG tablet Take 40 mg by mouth daily.         DISCHARGE INSTRUCTIONS:    Follow with ENT clinic in 1 week.  If you experience worsening of your admission symptoms, develop shortness of breath, life threatening emergency, suicidal or homicidal thoughts you must seek medical attention immediately by calling 911 or calling your MD immediately  if symptoms less severe.  You Must read complete instructions/literature along with all the possible adverse reactions/side effects for all the Medicines you take and that have been prescribed to you. Take any new Medicines after you have completely understood and accept all the possible adverse reactions/side effects.   Please note  You were cared for by a hospitalist during your hospital stay. If you have any questions about your discharge medications or the care you received while you were in the hospital after you are discharged, you can call the unit and asked to speak with the hospitalist on call if the hospitalist that took care of you is not available. Once you are discharged, your primary care physician will handle  any further medical issues. Please note that NO REFILLS for any discharge medications will be authorized once you are discharged, as it is imperative that you return to your primary care physician (or establish a relationship with a primary care physician if you do not have one) for your aftercare needs so that they can reassess your need for medications and monitor your lab values.    Today   CHIEF COMPLAINT:   Chief Complaint  Patient  presents with  . Fall  . Shoulder Pain    HISTORY OF PRESENT ILLNESS:  Nathaniel Villanueva  is a 81 y.o. male with a known history of hypertension, diabetes and hyperlipidemia who presents with above complaint. Patient reports that he he was using the bathroom and on his way to his bed when he fell. He says he did lose consciousness. He had no symptoms prior to the episode. He was brought to the emergency room via EMS and found to have a bilateral nasal fracture. He has been in his usual state of health. He denies chest pain or seizure-like activity.  VITAL SIGNS:  Blood pressure 141/65, pulse 56, temperature 99.4 F (37.4 C), temperature source Oral, resp. rate 18, height 6' (1.829 m), weight 104 kg (229 lb 4.5 oz), SpO2 99 %.  I/O:   Intake/Output Summary (Last 24 hours) at 04/07/15 1041 Last data filed at 04/07/15 0421  Gross per 24 hour  Intake    630 ml  Output   1600 ml  Net   -970 ml    PHYSICAL EXAMINATION:   GENERAL: 81 y.o.-year-old patient lying in the bed with no acute distress.  EYES: Pupils equal, round, reactive to light and accommodation. No scleral icterus. Extraocular muscles intact.  HEENT: Head atraumatic, normocephalic. Oropharynx and nasopharynx clear. Nasal bone have some contusion and dried blood. NECK: Supple, no jugular venous distention. No thyroid enlargement, no tenderness.  LUNGS: Normal breath sounds bilaterally, no wheezing, rales,rhonchi or crepitation. No use of accessory muscles of respiration.  CARDIOVASCULAR: S1, S2 normal. No murmurs, rubs, or gallops.  ABDOMEN: Soft, nontender, nondistended. Bowel sounds present. No organomegaly or mass.  EXTREMITIES: No pedal edema, cyanosis, or clubbing.  NEUROLOGIC: Cranial nerves II through XII are intact. Muscle strength 5/5 in all extremities. Sensation intact. Gait not checked.  PSYCHIATRIC: The patient is alert and oriented x 3.  SKIN: No obvious rash, lesion, or ulcer.   DATA REVIEW:    CBC  Recent Labs Lab 04/06/15 1121  WBC 5.9  HGB 11.9*  HCT 36.3*  PLT 156    Chemistries   Recent Labs Lab 04/06/15 0400  NA 140  K 4.1  CL 111  CO2 26  GLUCOSE 120*  BUN 14  CREATININE 1.64*  CALCIUM 8.5*    Cardiac Enzymes  Recent Labs Lab 04/05/15 2254  TROPONINI <0.03    Microbiology Results  No results found for this or any previous visit.  RADIOLOGY:  No results found.   Management plans discussed with the patient, family and they are in agreement.  CODE STATUS:     Code Status Orders        Start     Ordered   04/05/15 1100  Full code   Continuous     04/05/15 1059      TOTAL TIME TAKING CARE OF THIS PATIENT: 35 minutes.  Pt's daughter present in room- discussed finding and discharge plan with her also.  Vaughan Basta M.D on 04/07/2015 at 10:41 AM  Between 7am to  6pm - Pager - [phone]  After 6pm go to [email or web address] - password [email or web address] [facility]  Office  [phone]  CC: Primary care physician; Sherrin Daisy, MD   Note: This dictation was prepared with Dragon dictation along with smaller phrase technology. Any transcriptional errors that result from this process are unintentional.

## 2015-08-30 DIAGNOSIS — F419 Anxiety disorder, unspecified: Secondary | ICD-10-CM | POA: Insufficient documentation

## 2015-09-07 ENCOUNTER — Emergency Department
Admission: EM | Admit: 2015-09-07 | Discharge: 2015-09-07 | Disposition: A | Discharge status: Home / Self Care | Payer: Medicare Other | Attending: Emergency Medicine | Admitting: Emergency Medicine

## 2015-09-07 ENCOUNTER — Emergency Department: Payer: Medicare Other

## 2015-09-07 DIAGNOSIS — I10 Essential (primary) hypertension: Secondary | ICD-10-CM | POA: Diagnosis not present

## 2015-09-07 DIAGNOSIS — Z79899 Other long term (current) drug therapy: Secondary | ICD-10-CM | POA: Insufficient documentation

## 2015-09-07 DIAGNOSIS — Z87891 Personal history of nicotine dependence: Secondary | ICD-10-CM | POA: Diagnosis not present

## 2015-09-07 DIAGNOSIS — Z792 Long term (current) use of antibiotics: Secondary | ICD-10-CM | POA: Diagnosis not present

## 2015-09-07 DIAGNOSIS — R140 Abdominal distension (gaseous): Secondary | ICD-10-CM | POA: Diagnosis not present

## 2015-09-07 DIAGNOSIS — E119 Type 2 diabetes mellitus without complications: Secondary | ICD-10-CM | POA: Diagnosis not present

## 2015-09-07 DIAGNOSIS — K5900 Constipation, unspecified: Secondary | ICD-10-CM | POA: Diagnosis not present

## 2015-09-07 LAB — COMPREHENSIVE METABOLIC PANEL
ALBUMIN: 4.4 g/dL (ref 3.5–5.0)
ALK PHOS: 64 U/L (ref 38–126)
ALT: 17 U/L (ref 17–63)
ANION GAP: 7 (ref 5–15)
AST: 24 U/L (ref 15–41)
BILIRUBIN TOTAL: 0.7 mg/dL (ref 0.3–1.2)
BUN: 18 mg/dL (ref 6–20)
CALCIUM: 9 mg/dL (ref 8.9–10.3)
CO2: 26 mmol/L (ref 22–32)
Chloride: 107 mmol/L (ref 101–111)
Creatinine, Ser: 1.58 mg/dL — ABNORMAL HIGH (ref 0.61–1.24)
GFR calc Af Amer: 45 mL/min — ABNORMAL LOW (ref >60)
GFR calc non Af Amer: 39 mL/min — ABNORMAL LOW (ref >60)
GLUCOSE: 123 mg/dL — AB (ref 65–99)
Potassium: 4.6 mmol/L (ref 3.5–5.1)
Sodium: 140 mmol/L (ref 135–145)
TOTAL PROTEIN: 7.6 g/dL (ref 6.5–8.1)

## 2015-09-07 LAB — CBC
HCT: 39.4 % — ABNORMAL LOW (ref 40.0–52.0)
HEMOGLOBIN: 13.4 g/dL (ref 13.0–18.0)
MCH: 32.1 pg (ref 26.0–34.0)
MCHC: 33.9 g/dL (ref 32.0–36.0)
MCV: 94.7 fL (ref 80.0–100.0)
Platelets: 172 10*3/uL (ref 150–440)
RBC: 4.17 MIL/uL — ABNORMAL LOW (ref 4.40–5.90)
RDW: 12.8 % (ref 11.5–14.5)
WBC: 3.8 10*3/uL (ref 3.8–10.6)

## 2015-09-07 LAB — LIPASE, BLOOD: Lipase: 66 U/L — ABNORMAL HIGH (ref 11–51)

## 2015-09-07 NOTE — Discharge Instructions (Signed)
You were seen in the emergency department today for abdominal bloating that we think may be the result of mild constipation.  Your workup today was reassuring.  We recommend that you use one or more of the following over-the-counter medications in the order described:   1)  Colace (or Dulcolax) 100 mg:  This is a stool softener, and you may take it once or twice a day as needed. 2)  Senna tablets:  This is a bowel stimulant that will help "push" out your stool. It is the next step to add after you have tried a stool softener. 3)  Miralax (powder):  This medication works by drawing additional fluid into your intestines and helps to flush out your stool.  Mix the powder with water or juice according to label instructions.  It may help if the Colace and Senna are not sufficient, but you must be sure to use the recommended amount of water or juice when you mix up the powder. Remember that narcotic pain medications are constipating, so avoid them or minimize their use.  Drink plenty of fluids.  Please return to the Emergency Department immediately if you develop new or worsening symptoms that concern you, such as (but not limited to) fever > 101 degrees, severe abdominal pain, or persistent vomiting.   Constipation Constipation is when a person has fewer than three bowel movements a week, has difficulty having a bowel movement, or has stools that are dry, hard, or larger than normal. As people grow older, constipation is more common. If you try to fix constipation with medicines that make you have a bowel movement (laxatives), the problem may get worse. Long-term laxative use may cause the muscles of the colon to become weak. A low-fiber diet, not taking in enough fluids, and taking certain medicines may make constipation worse.  CAUSES   Certain medicines, such as antidepressants, pain medicine, iron supplements, antacids, and water pills.   Certain diseases, such as diabetes, irritable bowel syndrome  (IBS), thyroid disease, or depression.   Not drinking enough water.   Not eating enough fiber-rich foods.   Stress or travel.   Lack of physical activity or exercise.   Ignoring the urge to have a bowel movement.   Using laxatives too much.  SIGNS AND SYMPTOMS   Having fewer than three bowel movements a week.   Straining to have a bowel movement.   Having stools that are hard, dry, or larger than normal.   Feeling full or bloated.   Pain in the lower abdomen.   Not feeling relief after having a bowel movement.  DIAGNOSIS  Your health care provider will take a medical history and perform a physical exam. Further testing may be done for severe constipation. Some tests may include:  A barium enema X-ray to examine your rectum, colon, and, sometimes, your small intestine.   A sigmoidoscopy to examine your lower colon.   A colonoscopy to examine your entire colon. TREATMENT  Treatment will depend on the severity of your constipation and what is causing it. Some dietary treatments include drinking more fluids and eating more fiber-rich foods. Lifestyle treatments may include regular exercise. If these diet and lifestyle recommendations do not help, your health care provider may recommend taking over-the-counter laxative medicines to help you have bowel movements. Prescription medicines may be prescribed if over-the-counter medicines do not work.  HOME CARE INSTRUCTIONS   Eat foods that have a lot of fiber, such as fruits, vegetables, whole grains, and beans.  Limit foods high in fat and processed sugars, such as french fries, hamburgers, cookies, candies, and soda.   A fiber supplement may be added to your diet if you cannot get enough fiber from foods.   Drink enough fluids to keep your urine clear or pale yellow.   Exercise regularly or as directed by your health care provider.   Go to the restroom when you have the urge to go. Do not hold it.   Only  take over-the-counter or prescription medicines as directed by your health care provider. Do not take other medicines for constipation without talking to your health care provider first.  [place] IF:   You have bright red blood in your stool.   Your constipation lasts for more than 4 days or gets worse.   You have abdominal or rectal pain.   You have thin, pencil-like stools.   You have unexplained weight loss. MAKE SURE YOU:   Understand these instructions.  Will watch your condition.  Will get help right away if you are not doing well or get worse. Document Released: 03/20/2004 Document Revised: 06/27/2013 Document Reviewed: 04/03/2013 [facility] Patient Information 2015 [place], [place]. This information is not intended to replace advice given to you by your health care provider. Make sure you discuss any questions you have with your health care provider.    Abdominal Pain, Adult Many things can cause abdominal pain. Usually, abdominal pain is not caused by a disease and will improve without treatment. It can often be observed and treated at home. Your health care provider will do a physical exam and possibly order blood tests and X-rays to help determine the seriousness of your pain. However, in many cases, more time must pass before a clear cause of the pain can be found. Before that point, your health care provider may not know if you need more testing or further treatment. HOME CARE INSTRUCTIONS Monitor your abdominal pain for any changes. The following actions may help to alleviate any discomfort you are experiencing:  Only take over-the-counter or prescription medicines as directed by your health care provider.  Do not take laxatives unless directed to do so by your health care provider.  Try a clear liquid diet (broth, tea, or water) as directed by your health care provider. Slowly move to a bland diet as tolerated. SEEK MEDICAL CARE IF:  You have  unexplained abdominal pain.  You have abdominal pain associated with nausea or diarrhea.  You have pain when you urinate or have a bowel movement.  You experience abdominal pain that wakes you in the night.  You have abdominal pain that is worsened or improved by eating food.  You have abdominal pain that is worsened with eating fatty foods.  You have a fever. SEEK IMMEDIATE MEDICAL CARE IF:  Your pain does not go away within 2 hours.  You keep throwing up (vomiting).  Your pain is felt only in portions of the abdomen, such as the right side or the left lower portion of the abdomen.  You pass bloody or black tarry stools. MAKE SURE YOU:  Understand these instructions.  Will watch your condition.  Will get help right away if you are not doing well or get worse.   This information is not intended to replace advice given to you by your health care provider. Make sure you discuss any questions you have with your health care provider.   Document Released: 04/01/2005 Document Revised: 03/13/2015 Document Reviewed: 03/01/2013 Elsevier  Interactive Patient Education ©2016 [employer] ° °

## 2015-09-07 NOTE — ED Notes (Signed)
Patient reports "gassy" feeling for approximately 1 week.  Used laxative with good results on Thursday (reports normal appearance).  Tonight "I didn't think I was going to pass the gas and got worried."

## 2015-09-07 NOTE — ED Notes (Signed)
Pt. Stated he had colonoscopy performed 6 months ago and they removed some palyops.  Pt. States he was put on protonix after surgery and is still taking.  Pt. States he felt bloated yesterday morning.

## 2015-09-07 NOTE — ED Notes (Signed)
Pt. Going home by self, stated he will follow up with pcp.

## 2015-09-07 NOTE — ED Provider Notes (Signed)
[facility] Emergency Department Provider Note  ____________________________________________  Time seen: Approximately 5:30 AM  I have reviewed the triage vital signs and the nursing notes.   HISTORY  Chief Complaint Bloated    HPI KEDEN RISINGER is a 82 y.o. male who presents with feelings of gassiness and bloating for about one week.  He reports that he had a good bowel movement about 24 hours ago but today he felt bloated again and felt like he was not going to be able to pass gas.  He describes his symptoms as moderate and nothing is making it better or worse.  He does have issues with chronic constipation and occasionally takes MiraLAX and/or Dulcolax.  He denies nausea and vomiting and states that he has a good appetite.  He denies dysuria.  He also denies fever/chills, chest pain, shortness of breath, and abdominal pain.  His only abdominal issues are with a sensation of bloating.   Past Medical History  Diagnosis Date  . Hypertension   . Diabetes mellitus without complication   . Hyperlipidemia   . Anemia   . Hypoglycemia     Patient Active Problem List   Diagnosis Date Noted  . Syncope 04/05/2015    Past Surgical History  Procedure Laterality Date  . Colonoscopy with propofol N/A 03/04/2015    Procedure: COLONOSCOPY WITH PROPOFOL;  Surgeon: Lollie Sails, MD;  Location: [facility] ENDOSCOPY;  Service: Endoscopy;  Laterality: N/A;  . Esophagogastroduodenoscopy (egd) with propofol N/A 03/04/2015    Procedure: ESOPHAGOGASTRODUODENOSCOPY (EGD) WITH PROPOFOL;  Surgeon: Lollie Sails, MD;  Location: [facility] ENDOSCOPY;  Service: Endoscopy;  Laterality: N/A;    Current Outpatient Rx  Name  Route  Sig  Dispense  Refill  . amLODipine (NORVASC) 10 MG tablet   Oral   Take 10 mg by mouth daily.         Marland Kitchen amoxicillin-clavulanate (AUGMENTIN) 875-125 MG tablet   Oral   Take 1 tablet by mouth 2 (two) times daily.   12 tablet   0   . doxazosin  (CARDURA) 4 MG tablet   Oral   Take 4 mg by mouth daily.         . ergocalciferol (VITAMIN D2) 50000 UNITS capsule   Oral   Take 50,000 Units by mouth once a week.         Marland Kitchen HYDROcodone-acetaminophen (NORCO/VICODIN) 5-325 MG tablet   Oral   Take 1 tablet by mouth every 6 (six) hours as needed for moderate pain.   20 tablet   0   . lisinopril (PRINIVIL,ZESTRIL) 20 MG tablet   Oral   Take 20 mg by mouth daily.         . metoprolol succinate (TOPROL-XL) 100 MG 24 hr tablet   Oral   Take 100 mg by mouth daily. Take with or immediately following a meal.         . pantoprazole (PROTONIX) 40 MG tablet   Oral   Take 40 mg by mouth daily.         . polyethylene glycol (MIRALAX / GLYCOLAX) packet   Oral   Take 17 g by mouth daily.   14 each   0     Allergies Review of patient's allergies indicates no known allergies.  No family history on file.  Social History Social History  Substance Use Topics  . Smoking status: Former Research scientist (life sciences)  . Smokeless tobacco: Never Used  . Alcohol Use: No    Review  of Systems Constitutional: No fever/chills Eyes: No visual changes. ENT: No sore throat. Cardiovascular: Denies chest pain. Respiratory: Denies shortness of breath. Gastrointestinal: No abdominal pain.  No nausea, no vomiting.  No diarrhea.  Chronic constipation.  Sensation of bloating of his abdomen. Genitourinary: Negative for dysuria. Musculoskeletal: Negative for back pain. Skin: Negative for rash. Neurological: Negative for headaches, focal weakness or numbness.  10-point ROS otherwise negative.  ____________________________________________   PHYSICAL EXAM:  VITAL SIGNS: ED Triage Vitals  Enc Vitals Group     BP 09/07/15 0222 145/77 mmHg     Pulse Rate 09/07/15 0222 54     Resp 09/07/15 0222 18     Temp 09/07/15 0222 98.1 F (36.7 C)     Temp Source 09/07/15 0222 Oral     SpO2 09/07/15 0222 98 %     Weight 09/07/15 0222 210 lb (95.255 kg)     Height  09/07/15 0222 6' (1.829 m)     Head Cir --      Peak Flow --      Pain Score --      Pain Loc --      Pain Edu? --      Excl. in [place]? --     Constitutional: Alert and oriented. Well appearing and in no acute distress. Eyes: Conjunctivae are normal. PERRL. EOMI. Head: Atraumatic. Nose: No congestion/rhinnorhea. Mouth/Throat: Mucous membranes are moist.  Oropharynx non-erythematous. Neck: No stridor.   Cardiovascular: Normal rate, regular rhythm. Grossly normal heart sounds.  Good peripheral circulation. Respiratory: Normal respiratory effort.  No retractions. Lungs CTAB. Gastrointestinal: Soft and nontender to deep palpation.  Possibly mild distention but not tympanic and easily palpated. No abdominal bruits. No CVA tenderness. Musculoskeletal: No lower extremity tenderness nor edema.  No joint effusions. Neurologic:  Normal speech and language. No gross focal neurologic deficits are appreciated.  Skin:  Skin is warm, dry and intact. No rash noted. Psychiatric: Mood and affect are normal. Speech and behavior are normal.  ____________________________________________   LABS (all labs ordered are listed, but only abnormal results are displayed)  Labs Reviewed  LIPASE, BLOOD - Abnormal; Notable for the following:    Lipase 66 (*)    All other components within normal limits  COMPREHENSIVE METABOLIC PANEL - Abnormal; Notable for the following:    Glucose, Bld 123 (*)    Creatinine, Ser 1.58 (*)    GFR calc non Af Amer 39 (*)    GFR calc Af Amer 45 (*)    All other components within normal limits  CBC - Abnormal; Notable for the following:    RBC 4.17 (*)    HCT 39.4 (*)    All other components within normal limits  URINALYSIS COMPLETEWITH MICROSCOPIC ([facility] ONLY)   ____________________________________________  EKG  None ____________________________________________  RADIOLOGY   Dg Abd Acute W/chest  09/07/2015  CLINICAL DATA:  Acute onset of abdominal discomfort. Feeling  gassy. Initial encounter. EXAM: DG ABDOMEN ACUTE W/ 1V CHEST COMPARISON:  Chest radiograph performed 04/05/2015, and CT of the abdomen and pelvis performed 05/17/2014 FINDINGS: The lungs are well-aerated and clear. There is no evidence of focal opacification, pleural effusion or pneumothorax. The cardiomediastinal silhouette is within normal limits. A left-sided breast implant is noted. The visualized bowel gas pattern is unremarkable. Scattered stool and air are seen within the colon; there is no evidence of small bowel dilatation to suggest obstruction. No free intra-abdominal air is identified on the provided upright view. No acute osseous abnormalities are  seen; the sacroiliac joints are unremarkable in appearance. IMPRESSION: 1. Unremarkable bowel gas pattern; no free intra-abdominal air seen. Moderate amount of stool noted in the colon. 2. No acute cardiopulmonary process seen. Electronically Signed   By: Garald Balding M.D.   On: 09/07/2015 06:02    ____________________________________________   PROCEDURES  Procedure(s) performed: None  Critical Care performed: No ____________________________________________   INITIAL IMPRESSION / ASSESSMENT AND PLAN / ED COURSE  Pertinent labs & imaging results that were available during my care of the patient were reviewed by me and considered in my medical decision making (see chart for details).  The patient's workup is very reassuring.  It is important to note that he has no abdominal pain nor tenderness on exam.  His labs are all reassuring and consistent with prior.  He is not having any nausea or vomiting and his vital signs are all stable.  I explained to him that his loading sensation may be the result of some constipation since he has not had a bowel movement in more than 24 hours.  I gave him my usual and customary management medications and return precautions.  He understands and agrees with the  plan.  ____________________________________________  FINAL CLINICAL IMPRESSION(S) / ED DIAGNOSES  Final diagnoses:  Abdominal bloating      NEW MEDICATIONS STARTED DURING THIS VISIT:  New Prescriptions   No medications on file      Note:  This document was prepared using Dragon voice recognition software and may include unintentional dictation errors.   Hinda Kehr, MD 09/07/15 [phone]

## 2015-09-27 DIAGNOSIS — N183 Chronic kidney disease, stage 3 (moderate): Secondary | ICD-10-CM

## 2015-09-27 DIAGNOSIS — E1122 Type 2 diabetes mellitus with diabetic chronic kidney disease: Secondary | ICD-10-CM | POA: Insufficient documentation

## 2016-05-25 ENCOUNTER — Other Ambulatory Visit: Payer: Self-pay | Admitting: Nephrology

## 2016-05-25 DIAGNOSIS — N2889 Other specified disorders of kidney and ureter: Secondary | ICD-10-CM

## 2016-06-02 ENCOUNTER — Ambulatory Visit
Admission: RE | Admit: 2016-06-02 | Discharge: 2016-06-02 | Disposition: A | Discharge status: Home / Self Care | Payer: Medicare Other | Source: Ambulatory Visit | Attending: Nephrology | Admitting: Nephrology

## 2016-06-02 DIAGNOSIS — N281 Cyst of kidney, acquired: Secondary | ICD-10-CM | POA: Insufficient documentation

## 2016-06-02 DIAGNOSIS — I723 Aneurysm of iliac artery: Secondary | ICD-10-CM | POA: Insufficient documentation

## 2016-06-02 DIAGNOSIS — I700 Atherosclerosis of aorta: Secondary | ICD-10-CM | POA: Diagnosis not present

## 2016-06-02 DIAGNOSIS — N2889 Other specified disorders of kidney and ureter: Secondary | ICD-10-CM | POA: Insufficient documentation

## 2016-06-23 ENCOUNTER — Ambulatory Visit: Payer: Self-pay

## 2016-06-26 ENCOUNTER — Encounter: Payer: Self-pay | Admitting: Urology

## 2016-06-26 ENCOUNTER — Ambulatory Visit (INDEPENDENT_AMBULATORY_CARE_PROVIDER_SITE_OTHER): Payer: Medicare Other | Admitting: Urology

## 2016-06-26 VITALS — BP 178/84 | HR 63 | Ht 72.0 in | Wt 213.9 lb

## 2016-06-26 DIAGNOSIS — N2889 Other specified disorders of kidney and ureter: Secondary | ICD-10-CM | POA: Diagnosis not present

## 2016-06-26 NOTE — Progress Notes (Signed)
06/26/2016 10:28 AM   Nathaniel Villanueva 02-07-1931 [id number]  Referring provider: Sherrin Daisy, MD [place], [place] [id number]  Chief Complaint  Patient presents with  . New Patient (Initial Visit)    Renal mass    HPI: The patient is an 82 year old gentleman past medical history significant for CKD stage III who presents for evaluation of a complicated left renal cyst. The cyst is 11.5 cm in size that contains a 1 cm high attenuation mural nodule which has increased in size from previous exams. He also has multiple large bilateral simple cysts. The patient has no genitourinary complaints at this time. Denies urinary issues including frequency, urgency, and straining to urinate. No history of nephrolithiasis or UTI.   PMH: Past Medical History:  Diagnosis Date  . Anemia   . Diabetes mellitus without complication ([place])   . Hyperlipidemia   . Hypertension   . Hypoglycemia     Surgical History: Past Surgical History:  Procedure Laterality Date  . COLONOSCOPY WITH PROPOFOL N/A 03/04/2015   Procedure: COLONOSCOPY WITH PROPOFOL;  Surgeon: Lollie Sails, MD;  Location: [facility] ENDOSCOPY;  Service: Endoscopy;  Laterality: N/A;  . ESOPHAGOGASTRODUODENOSCOPY (EGD) WITH PROPOFOL N/A 03/04/2015   Procedure: ESOPHAGOGASTRODUODENOSCOPY (EGD) WITH PROPOFOL;  Surgeon: Lollie Sails, MD;  Location: [facility] ENDOSCOPY;  Service: Endoscopy;  Laterality: N/A;    Home Medications:  Allergies as of 06/26/2016   No Known Allergies     Medication List       Accurate as of 06/26/16 10:28 AM. Always use your most recent med list.          amLODipine 10 MG tablet Commonly known as:  NORVASC Take 10 mg by mouth daily.   amoxicillin-clavulanate 875-125 MG tablet Commonly known as:  AUGMENTIN Take 1 tablet by mouth 2 (two) times daily.   doxazosin 4 MG tablet Commonly known as:  CARDURA Take 4 mg by mouth daily.   ergocalciferol 50000 units capsule Commonly known as:   VITAMIN D2 Take 50,000 Units by mouth once a week.   HYDROcodone-acetaminophen 5-325 MG tablet Commonly known as:  NORCO/VICODIN Take 1 tablet by mouth every 6 (six) hours as needed for moderate pain.   lisinopril 20 MG tablet Commonly known as:  PRINIVIL,ZESTRIL Take 20 mg by mouth daily.   metoprolol succinate 100 MG 24 hr tablet Commonly known as:  TOPROL-XL Take 100 mg by mouth daily. Take with or immediately following a meal.   pantoprazole 40 MG tablet Commonly known as:  PROTONIX Take 40 mg by mouth daily.   polyethylene glycol packet Commonly known as:  MIRALAX / GLYCOLAX Take 17 g by mouth daily.       Allergies: No Known Allergies  Family History: Family History  Problem Relation Age of Onset  . Prostate cancer Neg Hx   . Bladder Cancer Neg Hx   . Kidney cancer Neg Hx     Social History:  reports that he has quit smoking. He has never used smokeless tobacco. He reports that he does not drink alcohol or use drugs.  ROS: UROLOGY Frequent Urination?: No Hard to postpone urination?: No Burning/pain with urination?: No Get up at night to urinate?: Yes Leakage of urine?: No Urine stream starts and stops?: No Trouble starting stream?: No Do you have to strain to urinate?: No Blood in urine?: No Urinary tract infection?: No Sexually transmitted disease?: No Injury to kidneys or bladder?: No Painful intercourse?: No Weak stream?: No Erection problems?: No  Penile pain?: No  Gastrointestinal Nausea?: No Vomiting?: No Indigestion/heartburn?: No Diarrhea?: No Constipation?: Yes  Constitutional Fever: No Night sweats?: No Weight loss?: Yes Fatigue?: No  Skin Skin rash/lesions?: No Itching?: No  Eyes Blurred vision?: No Double vision?: No  Ears/Nose/Throat Sore throat?: No Sinus problems?: No  Hematologic/Lymphatic Swollen glands?: No Easy bruising?: No  Cardiovascular Leg swelling?: No Chest pain?: No  Respiratory Cough?:  No Shortness of breath?: No  Endocrine Excessive thirst?: No  Musculoskeletal Back pain?: Yes Joint pain?: Yes  Neurological Headaches?: No Dizziness?: No  Psychologic Depression?: Yes Anxiety?: Yes  Physical Exam: BP (!) 178/84 (BP Location: Left Arm, Patient Position: Sitting, Cuff Size: Large)   Pulse 63   Ht 6' (1.829 m)   Wt 213 lb 14.4 oz (97 kg)   BMI 29.01 kg/m   Constitutional:  Alert and oriented, No acute distress. HEENT: [place] AT, moist mucus membranes.  Trachea midline, no masses. Cardiovascular: No clubbing, cyanosis, or edema. Respiratory: Normal respiratory effort, no increased work of breathing. GI: Abdomen is soft, nontender, nondistended, no abdominal masses GU: No CVA tenderness.  Skin: No rashes, bruises or suspicious lesions. Lymph: No cervical or inguinal adenopathy. Neurologic: Grossly intact, no focal deficits, moving all 4 extremities. Psychiatric: Normal mood and affect.  Laboratory Data: Lab Results  Component Value Date   WBC 3.8 09/07/2015   HGB 13.4 09/07/2015   HCT 39.4 (L) 09/07/2015   MCV 94.7 09/07/2015   PLT 172 09/07/2015    Lab Results  Component Value Date   CREATININE 1.58 (H) 09/07/2015    No results found for: PSA  No results found for: TESTOSTERONE  No results found for: HGBA1C  Urinalysis    Component Value Date/Time   COLORURINE YELLOW (A) 04/06/2015 1321   APPEARANCEUR CLEAR (A) 04/06/2015 1321   APPEARANCEUR Clear 01/25/2014 1651   LABSPEC 1.010 04/06/2015 1321   LABSPEC 1.004 01/25/2014 1651   PHURINE 6.0 04/06/2015 1321   GLUCOSEU 50 (A) 04/06/2015 1321   GLUCOSEU Negative 01/25/2014 1651   HGBUR NEGATIVE 04/06/2015 1321   BILIRUBINUR NEGATIVE 04/06/2015 1321   BILIRUBINUR Negative 01/25/2014 1651   KETONESUR NEGATIVE 04/06/2015 1321   PROTEINUR NEGATIVE 04/06/2015 1321   NITRITE NEGATIVE 04/06/2015 1321   LEUKOCYTESUR NEGATIVE 04/06/2015 1321   LEUKOCYTESUR Negative 01/25/2014 1651     Pertinent Imaging: CLINICAL DATA:  Gas for 1 year, hypertension, diabetes mellitus, skin cancer, followup  EXAM: CT ABDOMEN WITHOUT CONTRAST  TECHNIQUE: Multidetector CT imaging of the abdomen was performed following the standard protocol without IV contrast. Sagittal and coronal MPR images reconstructed from axial data set. Oral contrast was not administered.  COMPARISON:  05/17/2014  FINDINGS: Lower chest: Few blebs at lung bases. Subpleural nodule image previously in the LEFT lower lobe is not imaged on this exam.  Hepatobiliary: Small dependent density in gallbladder question gallstone. No gross gallbladder wall thickening by CT. Liver unremarkable. No biliary dilatation.  Pancreas: Normal appearance  Spleen: Normal appearance  Adrenals/Urinary Tract: Thickening of adrenal glands without discrete mass. Multiple BILATERAL low-attenuation foci in the kidneys compatible with cysts. Largest LEFT cyst measures 11.0 x 7.7 x 10.4 cm (previously 11.5 x 8.3 x 11.1 cm) and contains a small high attenuation mural nodular focus in 10 x 8 mm (previously 10 x 6 mm). Large cyst posteriorly at upper pole RIGHT kidney 8.7 x 7.7 x 8.4 cm (previously 8.4 x 7.5 x 8.0 cm), simple features. Largest cyst in RIGHT kidney measures 5.6 x 5.3 x 5.4 cm (  previously 5.4 x 5.0 x 5.3 cm). Additional cysts in both kidneys without definite solid mass, calculus, or hydronephrosis. No ureteral dilatation.  Stomach/Bowel: Stomach and visualized bowel loops normal appearance for technique  Vascular/Lymphatic: Atherosclerotic calcification aorta, maximum 2.7 cm diameter. Common iliac arteries appear minimally dilated bilaterally at 16 mm bilaterally. No adenopathy.  Other: No free air or free fluid. No hernia or inflammatory process.  Musculoskeletal: Unremarkable  IMPRESSION: Suspected small gallstone dependently in gallbladder.  Multiple BILATERAL renal cysts bearing from  stable in size to minimally larger since 2015.  Largest cyst is a complicated cyst at the mid LEFT kidney 11.5 x 8.3 x 11.1 cm containing a 10 x 8 mm high attenuation mural nodule which is slightly larger than the 10 x 6 mm seen on the previous exam.  Due to minimal enlargement of this mural nodule since previous exams, recommend characterization of this lesion by MR imaging with and without contrast.  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  Assessment & Plan:    1. Left renal mass I discussed with the patient that his cyst on the left side contains a lesion that has grown slightly in size and has high attenuation that could be concerning for malignancy. We discussed ways to follow-up on this lesion. We also discussed that the radiologist recommended a MRI renal protocol for this mass based on its appearance. I also offered surveillance via renal ultrasound as an alternative. The patient has elected to proceed with a more thorough workup of an MR renal mass protocol. He does have CKD stage III but his most recent creatinine GFR should be acceptable for contrast for this study. I recommended to the patient to ensure that he is well-hydrated the day before, day of, and the day after his imaging study. He will follow-up after undergoing the MRI.  Return for after MRI.  Nickie Retort, MD  [facility] [address] [place], [place] [address] [phone]

## 2016-07-10 ENCOUNTER — Ambulatory Visit
Admission: RE | Admit: 2016-07-10 | Discharge: 2016-07-10 | Disposition: A | Discharge status: Home / Self Care | Payer: Medicare Other | Source: Ambulatory Visit | Attending: Urology | Admitting: Urology

## 2016-07-10 DIAGNOSIS — N2889 Other specified disorders of kidney and ureter: Secondary | ICD-10-CM | POA: Insufficient documentation

## 2016-07-10 LAB — POCT I-STAT CREATININE: CREATININE: 1.5 mg/dL — AB (ref 0.61–1.24)

## 2016-07-10 MED ORDER — GADOBENATE DIMEGLUMINE 529 MG/ML IV SOLN
20.0000 mL | Freq: Once | INTRAVENOUS | Status: AC | PRN
  Administered 2016-07-10: 20 mL via INTRAVENOUS

## 2016-07-17 ENCOUNTER — Encounter: Payer: Self-pay | Admitting: Urology

## 2016-07-17 ENCOUNTER — Ambulatory Visit (INDEPENDENT_AMBULATORY_CARE_PROVIDER_SITE_OTHER): Payer: Medicare Other | Admitting: Urology

## 2016-07-17 VITALS — BP 160/83 | HR 65 | Ht 72.0 in | Wt 209.2 lb

## 2016-07-17 DIAGNOSIS — N281 Cyst of kidney, acquired: Secondary | ICD-10-CM | POA: Diagnosis not present

## 2016-07-17 NOTE — Progress Notes (Signed)
07/17/2016 3:02 PM   [name] July 17, 1930 [id number]  Referring provider: Sherrin Daisy, MD [place], [place] [id number]  Chief Complaint  Patient presents with  . Follow-up    MRI results    HPI: The patient is an 83 year old gentleman past medical history significant for CKD stage III who presents for evaluation of a complicated left renal cyst. The cyst is 11.5 cm in size that contains a 1 cm high attenuation mural nodule which has increased in size from previous exams. He also has multiple large bilateral simple cysts. The patient has no genitourinary complaints at this time. Denies urinary issues including frequency, urgency, and straining to urinate. No history of nephrolithiasis or UTI.  The patient underwent an MRI which showed a Bosniak one into cysts. There is concerning for nodularity was actually a small complex cyst that did not enhance. He had no worrisome renal lesions identified per the radiologist.     PMH: Past Medical History:  Diagnosis Date  . Anemia   . Diabetes mellitus without complication ([place])   . Hyperlipidemia   . Hypertension   . Hypoglycemia     Surgical History: Past Surgical History:  Procedure Laterality Date  . COLONOSCOPY WITH PROPOFOL N/A 03/04/2015   Procedure: COLONOSCOPY WITH PROPOFOL;  Surgeon: Lollie Sails, MD;  Location: [facility] ENDOSCOPY;  Service: Endoscopy;  Laterality: N/A;  . ESOPHAGOGASTRODUODENOSCOPY (EGD) WITH PROPOFOL N/A 03/04/2015   Procedure: ESOPHAGOGASTRODUODENOSCOPY (EGD) WITH PROPOFOL;  Surgeon: Lollie Sails, MD;  Location: [facility] ENDOSCOPY;  Service: Endoscopy;  Laterality: N/A;    Home Medications:  Allergies as of 07/17/2016   No Known Allergies     Medication List       Accurate as of 07/17/16  3:02 PM. Always use your most recent med list.          amLODipine 10 MG tablet Commonly known as:  NORVASC Take 10 mg by mouth daily.   amoxicillin-clavulanate 875-125 MG  tablet Commonly known as:  AUGMENTIN Take 1 tablet by mouth 2 (two) times daily.   doxazosin 4 MG tablet Commonly known as:  CARDURA Take 4 mg by mouth daily.   ergocalciferol 50000 units capsule Commonly known as:  VITAMIN D2 Take 50,000 Units by mouth once a week.   HYDROcodone-acetaminophen 5-325 MG tablet Commonly known as:  NORCO/VICODIN Take 1 tablet by mouth every 6 (six) hours as needed for moderate pain.   lisinopril 20 MG tablet Commonly known as:  PRINIVIL,ZESTRIL Take 20 mg by mouth daily.   metoprolol succinate 100 MG 24 hr tablet Commonly known as:  TOPROL-XL Take 100 mg by mouth daily. Take with or immediately following a meal.   pantoprazole 40 MG tablet Commonly known as:  PROTONIX Take 40 mg by mouth daily.   polyethylene glycol packet Commonly known as:  MIRALAX / GLYCOLAX Take 17 g by mouth daily.       Allergies: No Known Allergies  Family History: Family History  Problem Relation Age of Onset  . Prostate cancer Neg Hx   . Bladder Cancer Neg Hx   . Kidney cancer Neg Hx     Social History:  reports that he has quit smoking. He has never used smokeless tobacco. He reports that he does not drink alcohol or use drugs.  ROS: UROLOGY Frequent Urination?: No Hard to postpone urination?: No Burning/pain with urination?: No Get up at night to urinate?: No Leakage of urine?: No Urine stream starts and stops?: No Trouble  starting stream?: No Do you have to strain to urinate?: No Blood in urine?: No Urinary tract infection?: No Sexually transmitted disease?: No Injury to kidneys or bladder?: No Painful intercourse?: No Weak stream?: No Erection problems?: No Penile pain?: No  Gastrointestinal Nausea?: No Vomiting?: No Indigestion/heartburn?: No Diarrhea?: No Constipation?: No  Constitutional Fever: No Night sweats?: No Weight loss?: No Fatigue?: No  Skin Skin rash/lesions?: No Itching?: No  Eyes Blurred vision?: No Double  vision?: No  Ears/Nose/Throat Sore throat?: No Sinus problems?: No  Hematologic/Lymphatic Swollen glands?: No Easy bruising?: No  Cardiovascular Leg swelling?: No Chest pain?: No  Respiratory Cough?: No Shortness of breath?: No  Endocrine Excessive thirst?: No  Musculoskeletal Back pain?: No Joint pain?: No  Neurological Headaches?: No Dizziness?: No  Psychologic Depression?: No Anxiety?: No  Physical Exam: BP (!) 160/83 (BP Location: Left Arm, Patient Position: Sitting, Cuff Size: Large)   Pulse 65   Ht 6' (1.829 m)   Wt 209 lb 3.2 oz (94.9 kg)   BMI 28.37 kg/m   Constitutional:  Alert and oriented, No acute distress. HEENT: [place] AT, moist mucus membranes.  Trachea midline, no masses. Cardiovascular: No clubbing, cyanosis, or edema. Respiratory: Normal respiratory effort, no increased work of breathing. GI: Abdomen is soft, nontender, nondistended, no abdominal masses GU: No CVA tenderness.  Skin: No rashes, bruises or suspicious lesions. Lymph: No cervical or inguinal adenopathy. Neurologic: Grossly intact, no focal deficits, moving all 4 extremities. Psychiatric: Normal mood and affect.  Laboratory Data: Lab Results  Component Value Date   WBC 3.8 09/07/2015   HGB 13.4 09/07/2015   HCT 39.4 (L) 09/07/2015   MCV 94.7 09/07/2015   PLT 172 09/07/2015    Lab Results  Component Value Date   CREATININE 1.50 (H) 07/10/2016    No results found for: PSA  No results found for: TESTOSTERONE  No results found for: HGBA1C  Urinalysis    Component Value Date/Time   COLORURINE YELLOW (A) 04/06/2015 1321   APPEARANCEUR CLEAR (A) 04/06/2015 1321   APPEARANCEUR Clear 01/25/2014 1651   LABSPEC 1.010 04/06/2015 1321   LABSPEC 1.004 01/25/2014 1651   PHURINE 6.0 04/06/2015 1321   GLUCOSEU 50 (A) 04/06/2015 1321   GLUCOSEU Negative 01/25/2014 1651   HGBUR NEGATIVE 04/06/2015 1321   BILIRUBINUR NEGATIVE 04/06/2015 1321   BILIRUBINUR Negative 01/25/2014  1651   KETONESUR NEGATIVE 04/06/2015 1321   PROTEINUR NEGATIVE 04/06/2015 1321   NITRITE NEGATIVE 04/06/2015 1321   LEUKOCYTESUR NEGATIVE 04/06/2015 1321   LEUKOCYTESUR Negative 01/25/2014 1651    Pertinent Imaging: Reviewed as above - MRI  Assessment & Plan:    1. Renal cysts I discussed the patient has bilateral renal cysts that are Bosniak 1 and 2 which are benign and require no further follow-up. He'll follow-up with [place] as needed.  Return if symptoms worsen or fail to improve.  Nickie Retort, MD  [facility] [address], [place], [place] [address] [phone]

## 2016-09-21 DIAGNOSIS — I6522 Occlusion and stenosis of left carotid artery: Secondary | ICD-10-CM

## 2016-09-25 DIAGNOSIS — I6522 Occlusion and stenosis of left carotid artery: Secondary | ICD-10-CM

## 2016-09-26 DIAGNOSIS — Z09 Encounter for follow-up examination after completed treatment for conditions other than malignant neoplasm: Secondary | ICD-10-CM

## 2016-10-21 ENCOUNTER — Encounter: Payer: Self-pay | Admitting: *Deleted

## 2016-10-21 ENCOUNTER — Ambulatory Visit
Admission: EM | Admit: 2016-10-21 | Discharge: 2016-10-21 | Disposition: A | Discharge status: Home / Self Care | Payer: Medicare Other | Attending: Family Medicine | Admitting: Family Medicine

## 2016-10-21 DIAGNOSIS — R252 Cramp and spasm: Secondary | ICD-10-CM | POA: Diagnosis not present

## 2016-10-21 DIAGNOSIS — M79605 Pain in left leg: Secondary | ICD-10-CM

## 2016-10-21 NOTE — ED Triage Notes (Signed)
Pt awoke with painful, "tight" sensation in left lower leg last night. States right is also affected but left was worse. Denies injury.

## 2016-10-21 NOTE — ED Provider Notes (Signed)
[facility]    CSN: [id number] Arrival date & time: 10/21/16  0855     History   Chief Complaint Chief Complaint  Patient presents with  . Leg Pain    HPI Nathaniel Villanueva is a 83 y.o. male.   83 yo diabetic male presents with a c/o "tightness" and "cramping" last night to his legs (but worse on the left). States this morning his left leg feels "tight". Denies any injury, redness, swelling, numbness/tingling, fevers, chills, chest pains, shortness of breath. Also denies any recent surgeries, new medications, prolong immobilization.    The history is provided by the patient.    Past Medical History:  Diagnosis Date  . Anemia   . Diabetes mellitus without complication ([place])   . Hyperlipidemia   . Hypertension   . Hypoglycemia     Patient Active Problem List   Diagnosis Date Noted  . Type 2 diabetes mellitus with stage 3 chronic kidney disease, without long-term current use of insulin ([place]) 09/27/2015  . Anxiety, mild 08/30/2015  . Syncope 04/05/2015  . Vitamin D deficiency 05/10/2014  . Hypoglycemia associated with diabetes ([place]) 02/01/2014  . Microalbuminuria 02/01/2014  . Anemia 01/02/2014  . Neck pain 01/02/2014  . HTN (hypertension) 01/02/2014  . Hypercholesterolemia 01/02/2014    Past Surgical History:  Procedure Laterality Date  . COLONOSCOPY WITH PROPOFOL N/A 03/04/2015   Procedure: COLONOSCOPY WITH PROPOFOL;  Surgeon: Lollie Sails, MD;  Location: [facility] ENDOSCOPY;  Service: Endoscopy;  Laterality: N/A;  . ESOPHAGOGASTRODUODENOSCOPY (EGD) WITH PROPOFOL N/A 03/04/2015   Procedure: ESOPHAGOGASTRODUODENOSCOPY (EGD) WITH PROPOFOL;  Surgeon: Lollie Sails, MD;  Location: [facility] ENDOSCOPY;  Service: Endoscopy;  Laterality: N/A;       Home Medications    Prior to Admission medications   Medication Sig Start Date End Date Taking? Authorizing Provider  amLODipine (NORVASC) 10 MG tablet Take 10 mg by mouth daily.   Yes Historical Provider, MD    doxazosin (CARDURA) 4 MG tablet Take 4 mg by mouth daily.   Yes Historical Provider, MD  lisinopril (PRINIVIL,ZESTRIL) 20 MG tablet Take 20 mg by mouth daily.   Yes Historical Provider, MD  metoprolol succinate (TOPROL-XL) 100 MG 24 hr tablet Take 100 mg by mouth daily. Take with or immediately following a meal.   Yes Historical Provider, MD  pantoprazole (PROTONIX) 40 MG tablet Take 40 mg by mouth daily.   Yes Historical Provider, MD  amoxicillin-clavulanate (AUGMENTIN) 875-125 MG tablet Take 1 tablet by mouth 2 (two) times daily. Patient not taking: Reported on 07/17/2016 04/07/15   Vaughan Basta, MD  ergocalciferol (VITAMIN D2) 50000 UNITS capsule Take 50,000 Units by mouth once a week.    Historical Provider, MD  HYDROcodone-acetaminophen (NORCO/VICODIN) 5-325 MG tablet Take 1 tablet by mouth every 6 (six) hours as needed for moderate pain. Patient not taking: Reported on 07/17/2016 04/07/15   Vaughan Basta, MD  polyethylene glycol (MIRALAX / GLYCOLAX) packet Take 17 g by mouth daily. Patient not taking: Reported on 07/17/2016 04/07/15   Vaughan Basta, MD    Family History Family History  Problem Relation Age of Onset  . Prostate cancer Neg Hx   . Bladder Cancer Neg Hx   . Kidney cancer Neg Hx     Social History Social History  Substance Use Topics  . Smoking status: Former Research scientist (life sciences)  . Smokeless tobacco: Never Used  . Alcohol use No     Allergies   Patient has no known allergies.   Review of  Systems Review of Systems   Physical Exam Triage Vital Signs ED Triage Vitals [10/21/16 0918]  Enc Vitals Group     BP (!) 168/72     Pulse Rate (!) 57     Resp 16     Temp 97.9 F (36.6 C)     Temp Source Oral     SpO2 100 %     Weight 200 lb (90.7 kg)     Height 6\' 1"  (1.854 m)     Head Circumference      Peak Flow      Pain Score      Pain Loc      Pain Edu?      Excl. in [place]?    No data found.   Updated Vital Signs BP (!) 168/72 (BP Location:  Left Arm)   Pulse (!) 57   Temp 97.9 F (36.6 C) (Oral)   Resp 16   Ht 6\' 1"  (1.854 m)   Wt 200 lb (90.7 kg)   SpO2 100%   BMI 26.39 kg/m   Visual Acuity Right Eye Distance:   Left Eye Distance:   Bilateral Distance:    Right Eye Near:   Left Eye Near:    Bilateral Near:     Physical Exam  Constitutional: He appears well-developed and well-nourished. No distress.  Musculoskeletal:       Left upper leg: Normal.       Left lower leg: Normal.       Left foot: Normal.  Left lower extremity neurovascularly intact; 2+ pulses; no edema; normal range of motion; no tenderness  Skin: He is not diaphoretic.  Nursing note and vitals reviewed.    UC Treatments / Results  Labs (all labs ordered are listed, but only abnormal results are displayed) Labs Reviewed - No data to display  EKG  EKG Interpretation None       Radiology No results found.  Procedures Procedures (including critical care time)  Medications Ordered in UC Medications - No data to display   Initial Impression / Assessment and Plan / UC Course  I have reviewed the triage vital signs and the nursing notes.  Pertinent labs & imaging results that were available during my care of the patient were reviewed by me and considered in my medical decision making (see chart for details).       Final Clinical Impressions(s) / UC Diagnoses   Final diagnoses:  Pain of left lower extremity  Muscle cramps    New Prescriptions Discharge Medication List as of 10/21/2016  9:49 AM     1. diagnosis reviewed with patient 2. Recommend supportive treatment with stretching, increased water/fluids, otc analgesics prn 3. Follow-up prn if symptoms worsen or don't improve   Norval Gable, MD 10/21/16 1037

## 2016-11-17 DIAGNOSIS — Z09 Encounter for follow-up examination after completed treatment for conditions other than malignant neoplasm: Secondary | ICD-10-CM

## 2017-09-17 ENCOUNTER — Other Ambulatory Visit: Payer: Self-pay

## 2017-09-17 ENCOUNTER — Emergency Department
Admission: EM | Admit: 2017-09-17 | Discharge: 2017-09-17 | Disposition: A | Discharge status: Home / Self Care | Payer: Medicare Other | Attending: Emergency Medicine | Admitting: Emergency Medicine

## 2017-09-17 ENCOUNTER — Encounter: Payer: Self-pay | Admitting: Emergency Medicine

## 2017-09-17 DIAGNOSIS — Z79899 Other long term (current) drug therapy: Secondary | ICD-10-CM | POA: Diagnosis not present

## 2017-09-17 DIAGNOSIS — N183 Chronic kidney disease, stage 3 (moderate): Secondary | ICD-10-CM | POA: Diagnosis not present

## 2017-09-17 DIAGNOSIS — R310 Gross hematuria: Secondary | ICD-10-CM | POA: Insufficient documentation

## 2017-09-17 DIAGNOSIS — I129 Hypertensive chronic kidney disease with stage 1 through stage 4 chronic kidney disease, or unspecified chronic kidney disease: Secondary | ICD-10-CM | POA: Insufficient documentation

## 2017-09-17 DIAGNOSIS — E1122 Type 2 diabetes mellitus with diabetic chronic kidney disease: Secondary | ICD-10-CM | POA: Insufficient documentation

## 2017-09-17 DIAGNOSIS — Z87891 Personal history of nicotine dependence: Secondary | ICD-10-CM | POA: Diagnosis not present

## 2017-09-17 LAB — BASIC METABOLIC PANEL
Anion gap: 10 (ref 5–15)
BUN: 13 mg/dL (ref 6–20)
CALCIUM: 8.5 mg/dL — AB (ref 8.9–10.3)
CO2: 21 mmol/L — AB (ref 22–32)
CREATININE: 1.41 mg/dL — AB (ref 0.61–1.24)
Chloride: 108 mmol/L (ref 101–111)
GFR calc Af Amer: 51 mL/min — ABNORMAL LOW (ref >60)
GFR calc non Af Amer: 44 mL/min — ABNORMAL LOW (ref >60)
GLUCOSE: 178 mg/dL — AB (ref 65–99)
Potassium: 3.8 mmol/L (ref 3.5–5.1)
Sodium: 139 mmol/L (ref 135–145)

## 2017-09-17 LAB — CBC
HEMATOCRIT: 37.1 % — AB (ref 40.0–52.0)
Hemoglobin: 12.2 g/dL — ABNORMAL LOW (ref 13.0–18.0)
MCH: 32 pg (ref 26.0–34.0)
MCHC: 33 g/dL (ref 32.0–36.0)
MCV: 97.1 fL (ref 80.0–100.0)
Platelets: 160 10*3/uL (ref 150–440)
RBC: 3.81 MIL/uL — ABNORMAL LOW (ref 4.40–5.90)
RDW: 13.6 % (ref 11.5–14.5)
WBC: 2.8 10*3/uL — ABNORMAL LOW (ref 3.8–10.6)

## 2017-09-17 LAB — URINALYSIS, COMPLETE (UACMP) WITH MICROSCOPIC
Bacteria, UA: NONE SEEN
Bilirubin Urine: NEGATIVE
GLUCOSE, UA: NEGATIVE mg/dL
Ketones, ur: NEGATIVE mg/dL
Leukocytes, UA: NEGATIVE
NITRITE: NEGATIVE
PH: 5 (ref 5.0–8.0)
Protein, ur: 30 mg/dL — AB
SPECIFIC GRAVITY, URINE: 1.006 (ref 1.005–1.030)
Squamous Epithelial / LPF: NONE SEEN

## 2017-09-17 NOTE — ED Triage Notes (Signed)
Pt to ED via POV, pt states that he has had blood in his urine x 3 days. Pt denies anything like this happening in the past. Pt denies abdominal pain or flank pain. Pt in NAD at this time.

## 2017-09-17 NOTE — ED Notes (Signed)
Pt unable to provide urine specimen at this time; pt provide cup of water and labeled urine specimen cup.  Pt advised to bring specimen to first nurse.

## 2017-09-17 NOTE — ED Provider Notes (Signed)
[facility] Emergency Department Provider Note   ____________________________________________    I have reviewed the triage vital signs and the nursing notes.   HISTORY  Chief Complaint Hematuria     HPI Nathaniel Villanueva is a 84 y.o. male who presents with hematuria.  Patient reports over the last 3 days he has noticed intermittent blood in his urine and occasionally very small clots.  No difficulty urinating.  No abdominal pain.  No flank pain.  No nausea or vomiting or fevers.  Does report one year ago had an MRI of cysts on his kidneys, I reviewed this MRI, cysts were found to appear benign.  Has not taken anything for this   Past Medical History:  Diagnosis Date  . Anemia   . Diabetes mellitus without complication ([place])   . Hyperlipidemia   . Hypertension   . Hypoglycemia     Patient Active Problem List   Diagnosis Date Noted  . Type 2 diabetes mellitus with stage 3 chronic kidney disease, without long-term current use of insulin ([place]) 09/27/2015  . Anxiety, mild 08/30/2015  . Syncope 04/05/2015  . Vitamin D deficiency 05/10/2014  . Hypoglycemia associated with diabetes ([place]) 02/01/2014  . Microalbuminuria 02/01/2014  . Anemia 01/02/2014  . Neck pain 01/02/2014  . HTN (hypertension) 01/02/2014  . Hypercholesterolemia 01/02/2014    Past Surgical History:  Procedure Laterality Date  . COLONOSCOPY WITH PROPOFOL N/A 03/04/2015   Procedure: COLONOSCOPY WITH PROPOFOL;  Surgeon: Lollie Sails, MD;  Location: [facility] ENDOSCOPY;  Service: Endoscopy;  Laterality: N/A;  . ESOPHAGOGASTRODUODENOSCOPY (EGD) WITH PROPOFOL N/A 03/04/2015   Procedure: ESOPHAGOGASTRODUODENOSCOPY (EGD) WITH PROPOFOL;  Surgeon: Lollie Sails, MD;  Location: [facility] ENDOSCOPY;  Service: Endoscopy;  Laterality: N/A;    Prior to Admission medications   Medication Sig Start Date End Date Taking? Authorizing Provider  amLODipine (NORVASC) 10 MG tablet Take 10 mg by mouth  daily.   Yes [provider]  bisacodyl (DULCOLAX) 5 MG EC tablet Take 5 mg by mouth daily as needed.   Yes [provider]  doxazosin (CARDURA) 4 MG tablet Take 4 mg by mouth daily.   Yes [provider]  lisinopril (PRINIVIL,ZESTRIL) 20 MG tablet Take 20 mg by mouth daily.   Yes [provider]  metoprolol succinate (TOPROL-XL) 100 MG 24 hr tablet Take 100 mg by mouth daily. Take with or immediately following a meal.   Yes [provider]  pantoprazole (PROTONIX) 40 MG tablet Take 40 mg by mouth daily.   Yes [provider]  amoxicillin-clavulanate (AUGMENTIN) 875-125 MG tablet Take 1 tablet by mouth 2 (two) times daily. Patient not taking: Reported on 07/17/2016 04/07/15   Vaughan Basta, MD  ergocalciferol (VITAMIN D2) 50000 UNITS capsule Take 50,000 Units by mouth once a week.    [provider]  HYDROcodone-acetaminophen (NORCO/VICODIN) 5-325 MG tablet Take 1 tablet by mouth every 6 (six) hours as needed for moderate pain. Patient not taking: Reported on 07/17/2016 04/07/15   Vaughan Basta, MD  polyethylene glycol [facility] / Floria Raveling) packet Take 17 g by mouth daily. Patient not taking: Reported on 07/17/2016 04/07/15   Vaughan Basta, MD     Allergies Patient has no known allergies.  Family History  Problem Relation Age of Onset  . Prostate cancer Neg Hx   . Bladder Cancer Neg Hx   . Kidney cancer Neg Hx     Social History Social History   Tobacco Use  . Smoking  status: Former Research scientist (life sciences)  . Smokeless tobacco: Never Used  Substance Use Topics  . Alcohol use: No  . Drug use: No    Review of Systems  Constitutional: No fevers Eyes: No visual changes.  ENT: No sore throat. Cardiovascular: No palpitations Respiratory: No cough Gastrointestinal: No abdominal pain or flank pain.  No nausea, no vomiting.   Genitourinary: No dysuria, hematuria as above Musculoskeletal: Negative for back  pain. Skin: Negative for rash. Neurological: Negative for headaches    ____________________________________________   PHYSICAL EXAM:  VITAL SIGNS: ED Triage Vitals  Enc Vitals Group     BP 09/17/17 1109 (!) 142/71     Pulse Rate 09/17/17 1109 95     Resp 09/17/17 1109 18     Temp 09/17/17 1109 98.9 F (37.2 C)     Temp Source 09/17/17 1109 Oral     SpO2 09/17/17 1109 99 %     Weight 09/17/17 1110 88.5 kg (195 lb)     Height 09/17/17 1110 1.803 m (5\' 11" )     Head Circumference --      Peak Flow --      Pain Score --      Pain Loc --      Pain Edu? --      Excl. in [place]? --     Constitutional: Alert and oriented. No acute distress. Pleasant and interactive Eyes: Conjunctivae are normal.   Nose: No congestion/rhinnorhea. Mouth/Throat: Mucous membranes are moist.    Cardiovascular: Normal rate, regular rhythm.  Good peripheral circulation. Respiratory: Normal respiratory effort.  No retractions. Gastrointestinal: Soft and nontender. No distention.  No CVA tenderness. Genitourinary: deferred Musculoskeletal: .  Warm and well perfused Neurologic:  Normal speech and language. No gross focal neurologic deficits are appreciated.  Skin:  Skin is warm, dry and intact. No rash noted. Psychiatric: Mood and affect are normal. Speech and behavior are normal.  ____________________________________________   LABS (all labs ordered are listed, but only abnormal results are displayed)  Labs Reviewed  URINALYSIS, COMPLETE (UACMP) WITH MICROSCOPIC - Abnormal; Notable for the following components:      Result Value   Color, Urine YELLOW (*)    APPearance CLEAR (*)    Hgb urine dipstick LARGE (*)    Protein, ur 30 (*)    All other components within normal limits  CBC - Abnormal; Notable for the following components:   WBC 2.8 (*)    RBC 3.81 (*)    Hemoglobin 12.2 (*)    HCT 37.1 (*)    All other components within normal limits  BASIC METABOLIC PANEL - Abnormal; Notable for the  following components:   CO2 21 (*)    Glucose, Bld 178 (*)    Creatinine, Ser 1.41 (*)    Calcium 8.5 (*)    GFR calc non Af Amer 44 (*)    GFR calc Af Amer 51 (*)    All other components within normal limits   ____________________________________________  EKG  None ____________________________________________  RADIOLOGY  None ____________________________________________   PROCEDURES  Procedure(s) performed: No  Procedures   Critical Care performed: No ____________________________________________   INITIAL IMPRESSION / ASSESSMENT AND PLAN / ED COURSE  Pertinent labs & imaging results that were available during my care of the patient were reviewed by me and considered in my medical decision making (see chart for details).  Patient presents with hematuria of unknown origin.  Exam is reassuring.  No concerns for retention at this time.  Hemoglobin  is stable.  No evidence of urinary tract infection.  Recommended close follow-up with urology for likely cystoscopy and further evaluation    ____________________________________________   FINAL CLINICAL IMPRESSION(S) / ED DIAGNOSES  Final diagnoses:  Gross hematuria        Note:  This document was prepared using Dragon voice recognition software and may include unintentional dictation errors.    Lavonia Drafts, MD 09/17/17 1438

## 2017-09-21 NOTE — Progress Notes (Signed)
09/22/2017 10:27 AM   Nathaniel Villanueva Dec 17, 1930 [id number]  Referring provider: Sherrin Daisy, MD No address on file  No chief complaint on file.   HPI: Patient is an 84 year old male who was seen in [facility] ED for gross hematuria with daughter, Nathaniel Villanueva.    Last week, he noticed some small spots of blood on his pajamas and in his underwear.  This continues to happen almost daily.  Patient denies any dysuria or suprapubic/flank pain.  Patient denies any fevers, chills, nausea or vomiting.   He does not have a prior history of recurrent urinary tract infections, nephrolithiasis, trauma to the genitourinary tract, BPH or malignancies of the genitourinary tract.   He does not have a family medical history of nephrolithiasis, malignancies of the genitourinary tract or hematuria.   Today, he is not having symptoms of frequent urination, urgency, dysuria, nocturia, incontinence, hesitancy, intermittency, straining to urinate or a weak urinary stream.  His UA today is negative.    He underwent a CT abdomen w/o in 05/2017 which noted suspected small gallstone dependently in gallbladder.  Multiple BILATERAL renal cysts bearing from stable in size to minimally larger since 2015.  Largest cyst is a complicated cyst at the mid LEFT kidney 11.5 x 8.3 x 11.1 cm containing a 10 x 8 mm high attenuation mural nodule which is slightly larger than the 10 x 6 mm seen on the previous exam.  Due to minimal enlargement of this mural nodule since previous exams, recommend characterization of this lesion by MR imaging with and without contrast.  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  MRI in 07/2016 noted renal lesions represent benign Bosniak category 1 and category 2 cysts. The area concerning for nodularity actually represents a small complex cyst and does not enhance at all. No worrisome renal lesion is identified.  He is a  former smoker, with a 1 ppd history.  Quit 50 years ago.  He was not exposed to secondhand smoke.  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      PMH: Past Medical History:  Diagnosis Date  . Anemia   . Diabetes mellitus without complication ([place])   . Hyperlipidemia   . Hypertension   . Hypoglycemia     Surgical History: Past Surgical History:  Procedure Laterality Date  . COLONOSCOPY WITH PROPOFOL N/A 03/04/2015   Procedure: COLONOSCOPY WITH PROPOFOL;  Surgeon: Lollie Sails, MD;  Location[facility] ENDOSCOPY;  Service: Endoscopy;  Laterality: N/A;  . ESOPHAGOGASTRODUODENOSCOPY (EGD) WITH PROPOFOL N/A 03/04/2015   Procedure: ESOPHAGOGASTRODUODENOSCOPY (EGD) WITH PROPOFOL;  Surgeon: Lollie Sails, MD;  Location: [facility] ENDOSCOPY;  Service: Endoscopy;  Laterality: N/A;    Home Medications:  Allergies as of 09/22/2017   No Known Allergies     Medication List        Accurate as of 09/22/17 10:27 AM. Always use your most recent med list.          amLODipine 10 MG tablet Commonly known as:  NORVASC Take 10 mg by mouth daily.   amoxicillin-clavulanate 875-125 MG tablet Commonly known as:  AUGMENTIN Take 1 tablet by mouth 2 (two) times daily.   bisacodyl 5 MG EC tablet Commonly known as:  DULCOLAX Take 5 mg by mouth daily as needed.   doxazosin 4 MG tablet Commonly known as:  CARDURA Take 4 mg by mouth daily.   ergocalciferol 50000 units capsule Commonly known as:  VITAMIN D2 Take 50,000 Units by mouth once a week.   HYDROcodone-acetaminophen 5-325 MG tablet Commonly known as:  NORCO/VICODIN Take 1 tablet by mouth every 6 (six) hours as needed for moderate pain.   lisinopril 20 MG tablet Commonly known as:  PRINIVIL,ZESTRIL Take 20 mg by mouth daily.   metoprolol succinate 100 MG 24 hr tablet Commonly known as:  TOPROL-XL Take 100 mg by mouth daily. Take with or immediately following a meal.   pantoprazole 40 MG tablet Commonly  known as:  PROTONIX Take 40 mg by mouth daily.   polyethylene glycol packet Commonly known as:  MIRALAX / GLYCOLAX Take 17 g by mouth daily.       Allergies: No Known Allergies  Family History: Family History  Problem Relation Age of Onset  . Prostate cancer Neg Hx   . Bladder Cancer Neg Hx   . Kidney cancer Neg Hx     Social History:  reports that he has quit smoking. he has never used smokeless tobacco. He reports that he does not drink alcohol or use drugs.  ROS: UROLOGY Frequent Urination?: No Hard to postpone urination?: No Burning/pain with urination?: No Get up at night to urinate?: No Leakage of urine?: No Urine stream starts and stops?: No Trouble starting stream?: No Do you have to strain to urinate?: No Blood in urine?: Yes Urinary tract infection?: No Sexually transmitted disease?: No Injury to kidneys or bladder?: No Painful intercourse?: No Weak stream?: No Erection problems?: No Penile pain?: No  Gastrointestinal Nausea?: No Vomiting?: No Indigestion/heartburn?: No Diarrhea?: No Constipation?: Yes  Constitutional Fever: No Night sweats?: No Weight loss?: Yes Fatigue?: No  Skin Skin rash/lesions?: No Itching?: No  Eyes Blurred vision?: No Double vision?: No  Ears/Nose/Throat Sore throat?: No Sinus problems?: No  Hematologic/Lymphatic Swollen glands?: No Easy bruising?: No  Cardiovascular Leg swelling?: No Chest pain?: No  Respiratory Cough?: No Shortness of breath?: No  Endocrine Excessive thirst?: No  Musculoskeletal Back pain?: No Joint pain?: No  Neurological Headaches?: No Dizziness?: No  Psychologic Depression?: No Anxiety?: No  Physical Exam: BP 137/72   Pulse (!) 55   Resp 16   Ht 6' (1.829 m)   Wt 202 lb 9.6 oz (91.9 kg)   SpO2 99%   BMI 27.48 kg/m   Constitutional: Well nourished. Alert and oriented, No acute distress. HEENT: [place] AT, moist mucus membranes. Trachea midline, no  masses. Cardiovascular: No clubbing, cyanosis, or edema. Respiratory: Normal respiratory effort, no increased work of breathing. GI: Abdomen is soft, non tender, non distended, no abdominal masses. Liver and spleen not palpable.  No hernias appreciated.  Stool sample for occult testing is not indicated.   GU: No CVA tenderness.  No bladder fullness or masses.  Patient with uncircumcised phallus.  Foreskin easily retracted  Urethral meatus is patent.  No penile discharge. No penile lesions or rashes. Scrotum without lesions, cysts, rashes and/or edema.  Testicles are located scrotally bilaterally. No masses are appreciated in the testicles. Left and right epididymis are normal. Rectal: Patient with  normal sphincter tone. Anus and perineum without scarring or rashes. No rectal masses are appreciated. Prostate is approximately 60 grams, no nodules are appreciated. Seminal vesicles are normal. Skin: No rashes, bruises or suspicious lesions. Lymph: No cervical or inguinal adenopathy. Neurologic: Grossly intact, no focal deficits, moving all 4 extremities. Psychiatric: Normal mood and affect.  Laboratory Data: Lab Results  Component Value Date   WBC 2.8 (L) 09/17/2017   HGB 12.2 (  L) 09/17/2017   HCT 37.1 (L) 09/17/2017   MCV 97.1 09/17/2017   PLT 160 09/17/2017    Lab Results  Component Value Date   CREATININE 1.41 (H) 09/17/2017    No results found for: PSA  No results found for: TESTOSTERONE  No results found for: HGBA1C  Urinalysis Negative.  See Epic.     Assessment & Plan:    1. Gross hematuria I've recommended a cystoscopy. I described how this is performed, typically in an office setting with a flexible cystoscope. We described the risks, benefits, and possible side effects, the most common of which is a minor amount of blood in the urine and/or burning which usually resolves in 24 to 48 hours.   The patient had the opportunity to ask questions which were answered. Based  upon this discussion, the patient is willing to proceed. Therefore, I've ordered a cystoscopy. UA Urine culture   2. Renal cysts Benign    Return for cystoscopy for hematuria .  Zara Council, [facility] [address], [place], [place] [address] [phone]

## 2017-09-22 ENCOUNTER — Encounter: Payer: Self-pay | Admitting: Urology

## 2017-09-22 ENCOUNTER — Ambulatory Visit (INDEPENDENT_AMBULATORY_CARE_PROVIDER_SITE_OTHER): Payer: Medicare Other | Admitting: Urology

## 2017-09-22 VITALS — BP 137/72 | HR 55 | Resp 16 | Ht 72.0 in | Wt 202.6 lb

## 2017-09-22 DIAGNOSIS — N281 Cyst of kidney, acquired: Secondary | ICD-10-CM | POA: Diagnosis not present

## 2017-09-22 DIAGNOSIS — R310 Gross hematuria: Secondary | ICD-10-CM | POA: Diagnosis not present

## 2017-09-22 LAB — MICROSCOPIC EXAMINATION
BACTERIA UA: NONE SEEN
Epithelial Cells (non renal): NONE SEEN /hpf (ref 0–10)
WBC UA: NONE SEEN /HPF (ref 0–?)

## 2017-09-22 LAB — URINALYSIS, COMPLETE
BILIRUBIN UA: NEGATIVE
GLUCOSE, UA: NEGATIVE
Ketones, UA: NEGATIVE
Leukocytes, UA: NEGATIVE
NITRITE UA: NEGATIVE
PH UA: 5.5 (ref 5.0–7.5)
Specific Gravity, UA: <1.005 — ABNORMAL LOW (ref 1.005–1.030)
UUROB: 0.2 mg/dL (ref 0.2–1.0)

## 2017-09-27 LAB — CULTURE, URINE COMPREHENSIVE

## 2017-10-01 ENCOUNTER — Encounter: Payer: Self-pay | Admitting: Urology

## 2017-10-01 ENCOUNTER — Ambulatory Visit (INDEPENDENT_AMBULATORY_CARE_PROVIDER_SITE_OTHER): Payer: Medicare Other | Admitting: Urology

## 2017-10-01 VITALS — BP 149/72 | HR 45 | Ht 72.0 in | Wt 196.7 lb

## 2017-10-01 DIAGNOSIS — R310 Gross hematuria: Secondary | ICD-10-CM

## 2017-10-01 LAB — URINALYSIS, COMPLETE
Bilirubin, UA: NEGATIVE
Glucose, UA: NEGATIVE
Ketones, UA: NEGATIVE
Leukocytes, UA: NEGATIVE
NITRITE UA: NEGATIVE
Protein, UA: NEGATIVE
Specific Gravity, UA: 1.01 (ref 1.005–1.030)
Urobilinogen, Ur: 0.2 mg/dL (ref 0.2–1.0)
pH, UA: 7 (ref 5.0–7.5)

## 2017-10-01 LAB — MICROSCOPIC EXAMINATION
BACTERIA UA: NONE SEEN
EPITHELIAL CELLS (NON RENAL): NONE SEEN /HPF (ref 0–10)
WBC, UA: NONE SEEN /hpf (ref 0–5)

## 2017-10-01 MED ORDER — LIDOCAINE HCL 2 % EX GEL
1.0000 "application " | Freq: Once | CUTANEOUS | Status: AC
  Administered 2017-10-01: 1 via URETHRAL

## 2017-10-01 MED ORDER — CIPROFLOXACIN HCL 500 MG PO TABS
500.0000 mg | ORAL_TABLET | Freq: Once | ORAL | Status: AC
Start: 2017-10-01 — End: 2017-10-01
  Administered 2017-10-01: 500 mg via ORAL

## 2017-10-01 NOTE — Progress Notes (Signed)
   10/01/17  CC:  Chief Complaint  Patient presents with  . Cysto    HPI: The patient is a 84 year old gentleman who presents for cystoscopy for completion of gross hematuria workup.  A few weeks ago, he noticed some small spots of blood on his pajamas and in his underwear. This continues to happen almost daily.  Patient denies any dysuria or suprapubic/flank pain.  Patient denies any fevers, chills, nausea or vomiting.   He underwent a CT without contrast in November 2017 which was positive for bilateral renal cyst.  He however had 11/2 cm cyst with a 1 cm high attenuation mural nodule that appeared to have grown since the previous exam.  As such he underwent an MRI in January 2018 which showed all this is to be Bosniak 1 or 2 and benign.  He is a former smoker, with a 1 ppd history.  Quit 50 years ago.  He was not exposed to secondhand smoke.  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     There were no vitals taken for this visit. NED. A&Ox3.   No respiratory distress   Abd soft, NT, ND Normal phallus with bilateral descended testicles  Cystoscopy Procedure Note  Patient identification was confirmed, informed consent was obtained, and patient was prepped using Betadine solution.  Lidocaine jelly was administered per urethral meatus.    Preoperative abx where received prior to procedure.     Pre-Procedure: - Inspection reveals a normal caliber ureteral meatus.  Procedure: The flexible cystoscope was introduced without difficulty - No urethral strictures/lesions are present. - Enlarged prostate visually obstructive.  Very hypervascular prostate.  Approximately 4-5 cm in length - Normal bladder neck - Bilateral ureteral orifices identified - Bladder mucosa  reveals no ulcers, tumors, or lesions - No bladder stones - No trabeculation  Retroflexion shows bulging of the prostate and the bladder which is very hypervascular.  No obvious median lobe  however.   Post-Procedure: - Patient tolerated the procedure well  Assessment/ Plan:  1. Gross hematuria -Secondary to very hypervascular prostate.  His hematuria has since resolved.  At this time, I do not think he needs further intervention.  If this becomes persistent he would benefit from finasteride.

## 2018-02-22 DIAGNOSIS — I6523 Occlusion and stenosis of bilateral carotid arteries: Secondary | ICD-10-CM

## 2018-02-22 DIAGNOSIS — Z7982 Long term (current) use of aspirin: Secondary | ICD-10-CM

## 2018-02-22 DIAGNOSIS — Z7901 Long term (current) use of anticoagulants: Secondary | ICD-10-CM

## 2018-02-22 DIAGNOSIS — Z6825 Body mass index (BMI) 25.0-25.9, adult: Secondary | ICD-10-CM

## 2018-09-23 ENCOUNTER — Encounter: Payer: Self-pay | Admitting: Emergency Medicine

## 2018-09-23 ENCOUNTER — Other Ambulatory Visit: Payer: Self-pay

## 2018-09-23 ENCOUNTER — Emergency Department: Payer: Medicare Other

## 2018-09-23 ENCOUNTER — Observation Stay
Admission: EM | Admit: 2018-09-23 | Discharge: 2018-09-24 | Disposition: A | Discharge status: Home / Self Care | Payer: Medicare Other | Attending: Internal Medicine | Admitting: Internal Medicine

## 2018-09-23 DIAGNOSIS — R319 Hematuria, unspecified: Secondary | ICD-10-CM | POA: Diagnosis not present

## 2018-09-23 DIAGNOSIS — N183 Chronic kidney disease, stage 3 (moderate): Secondary | ICD-10-CM | POA: Insufficient documentation

## 2018-09-23 DIAGNOSIS — N401 Enlarged prostate with lower urinary tract symptoms: Secondary | ICD-10-CM | POA: Insufficient documentation

## 2018-09-23 DIAGNOSIS — Z8249 Family history of ischemic heart disease and other diseases of the circulatory system: Secondary | ICD-10-CM | POA: Diagnosis not present

## 2018-09-23 DIAGNOSIS — R350 Frequency of micturition: Secondary | ICD-10-CM | POA: Diagnosis not present

## 2018-09-23 DIAGNOSIS — E1122 Type 2 diabetes mellitus with diabetic chronic kidney disease: Secondary | ICD-10-CM | POA: Insufficient documentation

## 2018-09-23 DIAGNOSIS — I4891 Unspecified atrial fibrillation: Secondary | ICD-10-CM | POA: Diagnosis present

## 2018-09-23 DIAGNOSIS — E785 Hyperlipidemia, unspecified: Secondary | ICD-10-CM | POA: Insufficient documentation

## 2018-09-23 DIAGNOSIS — R001 Bradycardia, unspecified: Secondary | ICD-10-CM | POA: Diagnosis not present

## 2018-09-23 DIAGNOSIS — N281 Cyst of kidney, acquired: Secondary | ICD-10-CM | POA: Insufficient documentation

## 2018-09-23 DIAGNOSIS — R300 Dysuria: Secondary | ICD-10-CM | POA: Insufficient documentation

## 2018-09-23 DIAGNOSIS — D631 Anemia in chronic kidney disease: Secondary | ICD-10-CM | POA: Diagnosis not present

## 2018-09-23 DIAGNOSIS — Z87891 Personal history of nicotine dependence: Secondary | ICD-10-CM | POA: Insufficient documentation

## 2018-09-23 DIAGNOSIS — N2889 Other specified disorders of kidney and ureter: Secondary | ICD-10-CM | POA: Insufficient documentation

## 2018-09-23 DIAGNOSIS — I129 Hypertensive chronic kidney disease with stage 1 through stage 4 chronic kidney disease, or unspecified chronic kidney disease: Secondary | ICD-10-CM | POA: Insufficient documentation

## 2018-09-23 DIAGNOSIS — Z79899 Other long term (current) drug therapy: Secondary | ICD-10-CM | POA: Insufficient documentation

## 2018-09-23 LAB — URINALYSIS, COMPLETE (UACMP) WITH MICROSCOPIC
BILIRUBIN URINE: NEGATIVE
Glucose, UA: NEGATIVE mg/dL
KETONES UR: NEGATIVE mg/dL
Leukocytes,Ua: NEGATIVE
Nitrite: NEGATIVE
PROTEIN: 30 mg/dL — AB
Specific Gravity, Urine: 1.005 (ref 1.005–1.030)
pH: 5 (ref 5.0–8.0)

## 2018-09-23 LAB — TROPONIN I

## 2018-09-23 LAB — BASIC METABOLIC PANEL
ANION GAP: 7 (ref 5–15)
BUN: 16 mg/dL (ref 8–23)
CO2: 23 mmol/L (ref 22–32)
CREATININE: 1.38 mg/dL — AB (ref 0.61–1.24)
Calcium: 9 mg/dL (ref 8.9–10.3)
Chloride: 109 mmol/L (ref 98–111)
GFR, EST AFRICAN AMERICAN: 53 mL/min — AB (ref >60)
GFR, EST NON AFRICAN AMERICAN: 46 mL/min — AB (ref >60)
Glucose, Bld: 146 mg/dL — ABNORMAL HIGH (ref 70–99)
Potassium: 4.1 mmol/L (ref 3.5–5.1)
Sodium: 139 mmol/L (ref 135–145)

## 2018-09-23 LAB — CBC
HCT: 36.3 % — ABNORMAL LOW (ref 39.0–52.0)
Hemoglobin: 11.9 g/dL — ABNORMAL LOW (ref 13.0–17.0)
MCH: 32.8 pg (ref 26.0–34.0)
MCHC: 32.8 g/dL (ref 30.0–36.0)
MCV: 100 fL (ref 80.0–100.0)
NRBC: 0 % (ref 0.0–0.2)
PLATELETS: 157 10*3/uL (ref 150–400)
RBC: 3.63 MIL/uL — ABNORMAL LOW (ref 4.22–5.81)
RDW: 12.2 % (ref 11.5–15.5)
WBC: 2.9 10*3/uL — ABNORMAL LOW (ref 4.0–10.5)

## 2018-09-23 MED ORDER — DOXAZOSIN MESYLATE 4 MG PO TABS
4.0000 mg | ORAL_TABLET | Freq: Every day | ORAL | Status: DC
  Administered 2018-09-24: 4 mg via ORAL
  Filled 2018-09-23: qty 1

## 2018-09-23 MED ORDER — AMLODIPINE BESYLATE 5 MG PO TABS
10.0000 mg | ORAL_TABLET | Freq: Every day | ORAL | Status: DC
  Administered 2018-09-24: 10 mg via ORAL
  Filled 2018-09-23: qty 2

## 2018-09-23 MED ORDER — ONDANSETRON HCL 4 MG/2ML IJ SOLN: 4.0000 mg | Freq: Four times a day (QID) | INTRAMUSCULAR | Status: DC | PRN

## 2018-09-23 MED ORDER — PANTOPRAZOLE SODIUM 40 MG PO TBEC
40.0000 mg | DELAYED_RELEASE_TABLET | Freq: Every day | ORAL | Status: DC
  Administered 2018-09-24: 40 mg via ORAL
  Filled 2018-09-23: qty 1

## 2018-09-23 MED ORDER — ACETAMINOPHEN 650 MG RE SUPP: 650.0000 mg | Freq: Four times a day (QID) | RECTAL | Status: DC | PRN

## 2018-09-23 MED ORDER — ONDANSETRON HCL 4 MG PO TABS: 4.0000 mg | ORAL_TABLET | Freq: Four times a day (QID) | ORAL | Status: DC | PRN

## 2018-09-23 MED ORDER — LISINOPRIL 10 MG PO TABS
20.0000 mg | ORAL_TABLET | Freq: Every day | ORAL | Status: DC
  Administered 2018-09-24: 20 mg via ORAL
  Filled 2018-09-23: qty 2

## 2018-09-23 MED ORDER — CEPHALEXIN 250 MG PO CAPS
250.0000 mg | ORAL_CAPSULE | Freq: Three times a day (TID) | ORAL | Status: DC
  Administered 2018-09-23 – 2018-09-24 (×3): 250 mg via ORAL
  Filled 2018-09-23 (×4): qty 1

## 2018-09-23 MED ORDER — ACETAMINOPHEN 325 MG PO TABS: 650.0000 mg | ORAL_TABLET | Freq: Four times a day (QID) | ORAL | Status: DC | PRN

## 2018-09-23 MED ORDER — BISACODYL 5 MG PO TBEC: 5.0000 mg | DELAYED_RELEASE_TABLET | Freq: Every day | ORAL | Status: DC | PRN

## 2018-09-23 NOTE — ED Provider Notes (Signed)
[facility] Emergency Department Provider Note    First MD Initiated Contact with Patient 09/23/18 1159     (approximate)  I have reviewed the triage vital signs and the nursing notes.   HISTORY  Chief Complaint Hematuria    HPI Nathaniel Villanueva is a 85 y.o. male presents the ER for chief complaint of generalized weakness as well as suprapubic discomfort.  Patient denies any chest pain.  No measured fevers.  Has been able to empty his bladder.  Feels like a simple heaviness.  Has had cystitis in the past does have known renal cysts but no known malignancy.  He is not currently on any blood thinners.    Past Medical History:  Diagnosis Date   Anemia    Diabetes mellitus without complication ([place])    Hyperlipidemia    Hypertension    Hypoglycemia    Family History  Problem Relation Age of Onset   Prostate cancer Neg Hx    Bladder Cancer Neg Hx    Kidney cancer Neg Hx    Past Surgical History:  Procedure Laterality Date   COLONOSCOPY WITH PROPOFOL N/A 03/04/2015   Procedure: COLONOSCOPY WITH PROPOFOL;  Surgeon: Lollie Sails, MD;  Location: [facility] ENDOSCOPY;  Service: Endoscopy;  Laterality: N/A;   ESOPHAGOGASTRODUODENOSCOPY (EGD) WITH PROPOFOL N/A 03/04/2015   Procedure: ESOPHAGOGASTRODUODENOSCOPY (EGD) WITH PROPOFOL;  Surgeon: Lollie Sails, MD;  Location: [facility] ENDOSCOPY;  Service: Endoscopy;  Laterality: N/A;   Patient Active Problem List   Diagnosis Date Noted   Type 2 diabetes mellitus with stage 3 chronic kidney disease, without long-term current use of insulin ([place]) 09/27/2015   Anxiety, mild 08/30/2015   Syncope 04/05/2015   Vitamin D deficiency 05/10/2014   Hypoglycemia associated with diabetes ([place]) 02/01/2014   Microalbuminuria 02/01/2014   Anemia 01/02/2014   Neck pain 01/02/2014   HTN (hypertension) 01/02/2014   Hypercholesterolemia 01/02/2014      Prior to Admission medications   Medication Sig Start  Date End Date Taking? Authorizing Provider  amLODipine (NORVASC) 10 MG tablet Take 10 mg by mouth daily.    [provider]  bisacodyl (DULCOLAX) 5 MG EC tablet Take 5 mg by mouth daily as needed.    [provider]  doxazosin (CARDURA) 4 MG tablet Take 4 mg by mouth daily.    [provider]  lisinopril (PRINIVIL,ZESTRIL) 20 MG tablet Take 20 mg by mouth daily.    [provider]  metoprolol succinate (TOPROL-XL) 100 MG 24 hr tablet Take 100 mg by mouth daily. Take with or immediately following a meal.    [provider]  pantoprazole (PROTONIX) 40 MG tablet Take 40 mg by mouth daily.    [provider]    Allergies Patient has no known allergies.    Social History Social History   Tobacco Use   Smoking status: Former Smoker   Smokeless tobacco: Never Used  Substance Use Topics   Alcohol use: No   Drug use: No    Review of Systems Patient denies headaches, rhinorrhea, blurry vision, numbness, shortness of breath, chest pain, edema, cough, abdominal pain, nausea, vomiting, diarrhea, dysuria, fevers, rashes or hallucinations unless otherwise stated above in HPI. ____________________________________________   PHYSICAL EXAM:  VITAL SIGNS: Vitals:   09/23/18 1230 09/23/18 1300  BP: 131/61 135/68  Pulse: (!) 45 (!) 46  Resp: 13 14  Temp:    SpO2: 100% 100%    Constitutional: Alert and oriented.  Eyes: Conjunctivae are  normal.  Head: Atraumatic. Nose: No congestion/rhinnorhea. Mouth/Throat: Mucous membranes are moist.   Neck: No stridor. Painless ROM.  Cardiovascular: Normal rate, regular rhythm. Grossly normal heart sounds.  Good peripheral circulation. Respiratory: Normal respiratory effort.  No retractions. Lungs CTAB. Gastrointestinal: Soft and nontender. No distention. No abdominal bruits. No CVA tenderness. Genitourinary: normal external genitalia Musculoskeletal: No lower extremity tenderness nor edema.  No  joint effusions. Neurologic:  Normal speech and language. No gross focal neurologic deficits are appreciated. No facial droop Skin:  Skin is warm, dry and intact. No rash noted. Psychiatric: Mood and affect are normal. Speech and behavior are normal.  ____________________________________________   LABS (all labs ordered are listed, but only abnormal results are displayed)  Results for orders placed or performed during the hospital encounter of 09/23/18 (from the past 24 hour(s))  Urinalysis, Complete w Microscopic     Status: Abnormal   Collection Time: 09/23/18 11:37 AM  Result Value Ref Range   Color, Urine AMBER (A) YELLOW   APPearance CLOUDY (A) CLEAR   Specific Gravity, Urine 1.005 1.005 - 1.030   pH 5.0 5.0 - 8.0   Glucose, UA NEGATIVE NEGATIVE mg/dL   Hgb urine dipstick LARGE (A) NEGATIVE   Bilirubin Urine NEGATIVE NEGATIVE   Ketones, ur NEGATIVE NEGATIVE mg/dL   Protein, ur 30 (A) NEGATIVE mg/dL   Nitrite NEGATIVE NEGATIVE   Leukocytes,Ua NEGATIVE NEGATIVE   RBC / HPF 11-20 0 - 5 RBC/hpf   WBC, UA 0-5 0 - 5 WBC/hpf   Bacteria, UA RARE (A) NONE SEEN   Squamous Epithelial / LPF 0-5 0 - 5   Mucus PRESENT   Basic metabolic panel     Status: Abnormal   Collection Time: 09/23/18 11:37 AM  Result Value Ref Range   Sodium 139 135 - 145 mmol/L   Potassium 4.1 3.5 - 5.1 mmol/L   Chloride 109 98 - 111 mmol/L   CO2 23 22 - 32 mmol/L   Glucose, Bld 146 (H) 70 - 99 mg/dL   BUN 16 8 - 23 mg/dL   Creatinine, Ser 1.38 (H) 0.61 - 1.24 mg/dL   Calcium 9.0 8.9 - 10.3 mg/dL   GFR calc non Af Amer 46 (L) >60 mL/min   GFR calc Af Amer 53 (L) >60 mL/min   Anion gap 7 5 - 15  CBC     Status: Abnormal   Collection Time: 09/23/18 11:37 AM  Result Value Ref Range   WBC 2.9 (L) 4.0 - 10.5 K/uL   RBC 3.63 (L) 4.22 - 5.81 MIL/uL   Hemoglobin 11.9 (L) 13.0 - 17.0 g/dL   HCT 36.3 (L) 39.0 - 52.0 %   MCV 100.0 80.0 - 100.0 fL   MCH 32.8 26.0 - 34.0 pg   MCHC 32.8 30.0 - 36.0 g/dL   RDW  12.2 11.5 - 15.5 %   Platelets 157 150 - 400 K/uL   nRBC 0.0 0.0 - 0.2 %  Troponin I - Add-On to previous collection     Status: None   Collection Time: 09/23/18 11:37 AM  Result Value Ref Range   Troponin I <0.03 <0.03 ng/mL   ____________________________________________  EKG My review and personal interpretation at Time: 12:30   Indication: bradycardia  Rate: 40  Rhythm: slow afib Axis: left Other: unchanged morphology as compared to previous tracings ____________________________________________  RADIOLOGY  I personally reviewed all radiographic images ordered to evaluate for the above acute complaints and reviewed radiology reports and findings.  These findings  were personally discussed with the patient.  Please see medical record for radiology report.  ____________________________________________   PROCEDURES  Procedure(s) performed:  Procedures    Critical Care performed: no ____________________________________________   INITIAL IMPRESSION / ASSESSMENT AND PLAN / ED COURSE  Pertinent labs & imaging results that were available during my care of the patient were reviewed by me and considered in my medical decision making (see chart for details).   DDX: UTI, cystitis, hematuria, stone, mass, electrolyte abnormality, anemia  Nathaniel Villanueva is a 85 y.o. who presents to the ED with symptoms as described above.  Patient nontoxic and afebrile.  His abdominal exam is soft and benign.  No evidence of bladder outlet obstruction.  Based on his suprapubic discomfort with fairly normal urinalysis will order CT imaging to exclude stone.  Patient also noted to be bradycardic.  Seems like slow A. fib versus other possible third-degree block though there is not any significant or traceable P waves that I can appreciate.  Does not have any history of this.  The patient will be placed on continuous pulse oximetry and telemetry for monitoring.  Laboratory evaluation will be sent to evaluate  for the above complaints.     Clinical Course as of Sep 22 1412  Fri Sep 23, 2018  1412 Patient does feel weak with ambulation given his bradycardia I do feel patient benefit from hospitalization for further medical management.  I do suspect this is polypharmacy particular as the patient is taking Toprol-XL.  Have discussed with the patient and available family all diagnostics and treatments performed thus far and all questions were answered to the best of my ability. The patient demonstrates understanding and agreement with plan.    [PR]    Clinical Course User Index [PR] Merlyn Lot, MD     As part of my medical decision making, I reviewed the following data within the [place] notes reviewed and incorporated, Labs reviewed, notes from prior ED visits and [place] Controlled Substance Database   ____________________________________________   FINAL CLINICAL IMPRESSION(S) / ED DIAGNOSES  Final diagnoses:  Symptomatic bradycardia  Atrial fibrillation with slow ventricular response ([place])      NEW MEDICATIONS STARTED DURING THIS VISIT:  New Prescriptions   No medications on file     Note:  This document was prepared using Dragon voice recognition software and may include unintentional dictation errors.    Merlyn Lot, MD 09/23/18 1414

## 2018-09-23 NOTE — Progress Notes (Addendum)
CCMD called and reported that pt HR went down to 31. Pt Asymtomatic. Notify Dr. Jannifer Franklin and order to just monitor pt. Will continue to monitor.   Update 0021: CCMD called and reported that pt have a frequent SVR and HR went down to 29 non sustain. Pt was asymptomatic on assessment. VSS except low HR. Dr. Jannifer Franklin states will place order. Will continue to monitor.  Update 0302: CCMD called and reported that pt just have a 2.51 paused HR 34. Pt asymptomatic on assessment. HR at this time 45. Page prime. Will continue to monitor.

## 2018-09-23 NOTE — ED Triage Notes (Addendum)
Patient presents to the ED due to having a very dark color to his urine this morning.  Patient denies dysuria and frequency.  Patient reports mild soreness to, "bladder area".

## 2018-09-23 NOTE — ED Notes (Signed)
ED TO INPATIENT HANDOFF REPORT  ED Nurse Name and Phone #: Laelynn Blizzard 40  S Name/Age/Gender Nathaniel Villanueva 85 y.o. male Room/Bed: ED11A/ED11A  Code Status   Code Status: Full Code  Home/SNF/Other Home {Patient oriented x4 Is this baseline? yes  Triage Complete: Triage complete  Chief Complaint Difficulty urinating   Triage Note Patient presents to the ED due to having a very dark color to his urine this morning.  Patient denies dysuria and frequency.  Patient reports mild soreness to, "bladder area".   Allergies No Known Allergies  Level of Care/Admitting Diagnosis ED Disposition    ED Disposition Condition [facility] Area: [place] [[id number]]  Level of Care: Telemetry [5]  Diagnosis: Bradycardia [[id number]]  Admitting Physician: Loletha Grayer [[id number]]  Attending Physician: Loletha Grayer [phone]  PT Class (Do Not Modify): Observation [104]  PT Acc Code (Do Not Modify): Observation [10022]       B Medical/Surgery History Past Medical History:  Diagnosis Date  . Anemia   . Diabetes mellitus without complication ([place])   . Hyperlipidemia   . Hypertension   . Hypoglycemia    Past Surgical History:  Procedure Laterality Date  . COLONOSCOPY WITH PROPOFOL N/A 03/04/2015   Procedure: COLONOSCOPY WITH PROPOFOL;  Surgeon: Lollie Sails, MD;  Location: [facility] ENDOSCOPY;  Service: Endoscopy;  Laterality: N/A;  . ESOPHAGOGASTRODUODENOSCOPY (EGD) WITH PROPOFOL N/A 03/04/2015   Procedure: ESOPHAGOGASTRODUODENOSCOPY (EGD) WITH PROPOFOL;  Surgeon: Lollie Sails, MD;  Location: [facility] ENDOSCOPY;  Service: Endoscopy;  Laterality: N/A;     A IV Location/Drains/Wounds Patient Lines/Drains/Airways Status   Active Line/Drains/Airways    Name:   Placement date:   Placement time:   Site:   Days:   Peripheral IV 04/05/15 Left Other (Comment)   04/05/15    0622    Other (Comment)   1267   Airway   03/04/15    1542     1299   Wound /  Incision (Open or Dehisced) 04/05/15 Laceration Nose Medial   04/05/15    1640    Nose   1267          Intake/Output Last 24 hours No intake or output data in the 24 hours ending 09/23/18 1505  Labs/Imaging Results for orders placed or performed during the hospital encounter of 09/23/18 (from the past 48 hour(s))  Urinalysis, Complete w Microscopic     Status: Abnormal   Collection Time: 09/23/18 11:37 AM  Result Value Ref Range   Color, Urine AMBER (A) YELLOW    Comment: BIOCHEMICALS MAY BE AFFECTED BY COLOR   APPearance CLOUDY (A) CLEAR   Specific Gravity, Urine 1.005 1.005 - 1.030   pH 5.0 5.0 - 8.0   Glucose, UA NEGATIVE NEGATIVE mg/dL   Hgb urine dipstick LARGE (A) NEGATIVE   Bilirubin Urine NEGATIVE NEGATIVE   Ketones, ur NEGATIVE NEGATIVE mg/dL   Protein, ur 30 (A) NEGATIVE mg/dL   Nitrite NEGATIVE NEGATIVE   Leukocytes,Ua NEGATIVE NEGATIVE   RBC / HPF 11-20 0 - 5 RBC/hpf   WBC, UA 0-5 0 - 5 WBC/hpf   Bacteria, UA RARE (A) NONE SEEN   Squamous Epithelial / LPF 0-5 0 - 5   Mucus PRESENT     Comment: Performed at [facility], [address] [place], [place] [address]  Basic metabolic panel     Status: Abnormal   Collection Time: 09/23/18 11:37 AM  Result Value Ref Range   Sodium 139  135 - 145 mmol/L   Potassium 4.1 3.5 - 5.1 mmol/L   Chloride 109 98 - 111 mmol/L   CO2 23 22 - 32 mmol/L   Glucose, Bld 146 (H) 70 - 99 mg/dL   BUN 16 8 - 23 mg/dL   Creatinine, Ser 1.38 (H) 0.61 - 1.24 mg/dL   Calcium 9.0 8.9 - 10.3 mg/dL   GFR calc non Af Amer 46 (L) >60 mL/min   GFR calc Af Amer 53 (L) >60 mL/min   Anion gap 7 5 - 15    Comment: Performed at [facility], [address] [place], [place] [address]  CBC     Status: Abnormal   Collection Time: 09/23/18 11:37 AM  Result Value Ref Range   WBC 2.9 (L) 4.0 - 10.5 K/uL   RBC 3.63 (L) 4.22 - 5.81 MIL/uL   Hemoglobin 11.9 (L) 13.0 - 17.0 g/dL   HCT 36.3 (L) 39.0 - 52.0 %   MCV 100.0 80.0 - 100.0  fL   MCH 32.8 26.0 - 34.0 pg   MCHC 32.8 30.0 - 36.0 g/dL   RDW 12.2 11.5 - 15.5 %   Platelets 157 150 - 400 K/uL   nRBC 0.0 0.0 - 0.2 %    Comment: Performed at [facility], [address] [place], [place] [address]  Troponin I - Add-On to previous collection     Status: None   Collection Time: 09/23/18 11:37 AM  Result Value Ref Range   Troponin I <0.03 <0.03 ng/mL    Comment: Performed at [facility], [address], [place], [place] [address]   Ct Renal Joaquim Lai Study  Result Date: 09/23/2018 CLINICAL DATA:  Bilateral lower pelvic pain.  Dark urine. EXAM: CT ABDOMEN AND PELVIS WITHOUT CONTRAST TECHNIQUE: Multidetector CT imaging of the abdomen and pelvis was performed following the standard protocol without IV contrast. COMPARISON:  CT 06/02/2016 and MRI abdomen 07/10/2016 FINDINGS: Lower chest: No acute findings. Few stable emphysematous blebs bilaterally. Hepatobiliary: Minimal cholelithiasis. Liver and biliary tree are normal. Pancreas: Normal. Spleen: Normal. Adrenals/Urinary Tract: Adrenal glands are normal. Kidneys are normal in size and demonstrate multiple bilateral cysts. There is a 1.2 cm hyperdense nodule over the lower pole left kidney slightly larger with Hounsfield unit measurements of 83 compatible with a hemorrhagic cyst. Ureters and bladder are normal. Stomach/Bowel: Stomach and small bowel are normal. Appendix is normal. Colon is within normal. Vascular/Lymphatic: Mild calcified plaque over the abdominal aorta. No adenopathy. Reproductive: Mild prostatic enlargement measuring 6.2 x 6.2 x 6.2 cm. Other: No free fluid or focal inflammatory change. Musculoskeletal: Degenerate change of the spine. Minimal loss of anterior vertebral body height of T12 and L1 unchanged. IMPRESSION: No acute findings in the abdomen/pelvis. Prostatic enlargement measuring 6.2 x 6.2 x 6.2 cm. Multiple bilateral renal cysts. 1.2 cm hemorrhagic cyst over the lower pole left kidney.  Minimal cholelithiasis. Aortic Atherosclerosis (ICD10-I70.0). Electronically Signed   By: Marin Olp M.D.   On: 09/23/2018 13:58    Pending Labs Unresulted Labs (From admission, onward)    Start     Ordered   09/24/18 2355  Basic metabolic panel  Tomorrow morning,   STAT     09/23/18 1502   09/24/18 0500  CBC  Tomorrow morning,   STAT     09/23/18 1502   09/23/18 1414  Urine Culture  Add-on,   AD     09/23/18 1413          Vitals/Pain Today's  Vitals   09/23/18 1130 09/23/18 1131 09/23/18 1230 09/23/18 1300  BP: (!) 154/56  131/61 135/68  Pulse: (!) 51  (!) 45 (!) 46  Resp: 16  13 14   Temp: 98.4 F (36.9 C)     TempSrc: Oral     SpO2: 98%  100% 100%  Weight:  86.2 kg    Height:  6' (1.829 m)    PainSc:  1       Isolation Precautions No active isolations  Medications Medications  acetaminophen (TYLENOL) tablet 650 mg (has no administration in time range)    Or  acetaminophen (TYLENOL) suppository 650 mg (has no administration in time range)  ondansetron (ZOFRAN) tablet 4 mg (has no administration in time range)    Or  ondansetron (ZOFRAN) injection 4 mg (has no administration in time range)    Mobility Low fall risk   Focused Assessments Lower abd pain   R

## 2018-09-23 NOTE — ED Notes (Signed)
Bladder scan reading of 200 mL at this time

## 2018-09-23 NOTE — Progress Notes (Signed)
Patient bladder scan post void showed 113 ml.  Will continue to monitor patient.

## 2018-09-23 NOTE — H&P (Signed)
[facility] NAME: Nathaniel Villanueva    MR#:  [id number]  DATE OF BIRTH:  [birth year]  DATE OF ADMISSION:  09/23/2018  PRIMARY CARE PHYSICIAN: Gaetano Net  REQUESTING/REFERRING PHYSICIAN: Dr Merlyn Lot  CHIEF COMPLAINT:   Chief Complaint  Patient presents with  . Hematuria    HISTORY OF PRESENT ILLNESS:  Nathaniel Villanueva  is a 85 y.o. male with a known history of hypertension diabetes presents with urinary symptoms of burning on urination, urinary frequency and blood in the urine.  He has had some cold chills and some weakness.  In the ER he was found to be in atrial fibrillation and bradycardic with heart rate sometimes dropping down into the 30s.  A CT scan of the abdomen showed a hemorrhagic kidney cyst and enlarged prostate.  Hospitalist services contacted for further evaluation  PAST MEDICAL HISTORY:   Past Medical History:  Diagnosis Date  . Anemia   . Diabetes mellitus without complication ([place])   . Hyperlipidemia   . Hypertension   . Hypoglycemia     PAST SURGICAL HISTORY:   Past Surgical History:  Procedure Laterality Date  . COLONOSCOPY WITH PROPOFOL N/A 03/04/2015   Procedure: COLONOSCOPY WITH PROPOFOL;  Surgeon: Lollie Sails, MD;  Location: [facility] ENDOSCOPY;  Service: Endoscopy;  Laterality: N/A;  . ESOPHAGOGASTRODUODENOSCOPY (EGD) WITH PROPOFOL N/A 03/04/2015   Procedure: ESOPHAGOGASTRODUODENOSCOPY (EGD) WITH PROPOFOL;  Surgeon: Lollie Sails, MD;  Location: [facility] ENDOSCOPY;  Service: Endoscopy;  Laterality: N/A;    SOCIAL HISTORY:   Social History   Tobacco Use  . Smoking status: Former Research scientist (life sciences)  . Smokeless tobacco: Never Used  Substance Use Topics  . Alcohol use: No    FAMILY HISTORY:   Family History  Problem Relation Age of Onset  . Hypertension Mother   . Prostate cancer Neg Hx   . Bladder Cancer Neg Hx   . Kidney cancer Neg Hx     DRUG ALLERGIES:  No Known Allergies  REVIEW  OF SYSTEMS:  CONSTITUTIONAL: No fever.  Some cold chills.  Some fatigue.  EYES: No blurred or double vision.  EARS, NOSE, AND THROAT: No tinnitus or ear pain. No sore throat RESPIRATORY: No cough, shortness of breath, wheezing or hemoptysis.  CARDIOVASCULAR: No chest pain, orthopnea, edema.  GASTROINTESTINAL: No nausea, vomiting, diarrhea or abdominal pain. No blood in bowel movements.  Some constipation GENITOURINARY: Positive for dysuria and hematuria.  ENDOCRINE: No polyuria, nocturia,  HEMATOLOGY: No anemia, easy bruising or bleeding SKIN: No rash or lesion. MUSCULOSKELETAL: Some hip pain NEUROLOGIC: No tingling, numbness, weakness.  PSYCHIATRY: No anxiety or depression.   MEDICATIONS AT HOME:   Prior to Admission medications   Medication Sig Start Date End Date Taking? Authorizing Provider  amLODipine (NORVASC) 10 MG tablet Take 10 mg by mouth daily.   Yes [provider]  bisacodyl (DULCOLAX) 5 MG EC tablet Take 5 mg by mouth daily as needed.   Yes [provider]  doxazosin (CARDURA) 4 MG tablet Take 4 mg by mouth daily.   Yes [provider]  lisinopril (PRINIVIL,ZESTRIL) 20 MG tablet Take 20 mg by mouth daily.   Yes [provider]  pantoprazole (PROTONIX) 40 MG tablet Take 40 mg by mouth daily.   Yes [provider]   Patient also takes metoprolol XL 100 mg p.o. daily  VITAL SIGNS:  Blood pressure (!) 163/81, pulse (!) 46, temperature 98.4 F (36.9 C), temperature source Oral, resp.  rate 13, height 6' (1.829 m), weight 86.2 kg, SpO2 100 %.  PHYSICAL EXAMINATION:  GENERAL:  85 y.o.-year-old patient lying in the bed with no acute distress.  EYES: Pupils equal, round, reactive to light and accommodation. No scleral icterus. Extraocular muscles intact.  HEENT: Head atraumatic, normocephalic. Oropharynx and nasopharynx clear.  NECK:  Supple, no jugular venous distention. No thyroid enlargement, no tenderness.  LUNGS: Normal breath  sounds bilaterally, no wheezing, rales,rhonchi or crepitation. No use of accessory muscles of respiration.  CARDIOVASCULAR: S1, S2 bradycardic. No murmurs, rubs, or gallops.  ABDOMEN: Soft, nontender, nondistended. Bowel sounds present. No organomegaly or mass.  EXTREMITIES: Trace pedal edema, no cyanosis, or clubbing.  NEUROLOGIC: Cranial nerves II through XII are intact. Muscle strength 5/5 in all extremities. Sensation intact. Gait not checked.  PSYCHIATRIC: The patient is alert and oriented x 3.  SKIN: No rash, lesion, or ulcer.   LABORATORY PANEL:   CBC Recent Labs  Lab 09/23/18 1137  WBC 2.9*  HGB 11.9*  HCT 36.3*  PLT 157   ------------------------------------------------------------------------------------------------------------------  Chemistries  Recent Labs  Lab 09/23/18 1137  NA 139  K 4.1  CL 109  CO2 23  GLUCOSE 146*  BUN 16  CREATININE 1.38*  CALCIUM 9.0   ------------------------------------------------------------------------------------------------------------------  Cardiac Enzymes Recent Labs  Lab 09/23/18 1137  TROPONINI <0.03   ------------------------------------------------------------------------------------------------------------------  RADIOLOGY:  Ct Renal Stone Study  Result Date: 09/23/2018 CLINICAL DATA:  Bilateral lower pelvic pain.  Dark urine. EXAM: CT ABDOMEN AND PELVIS WITHOUT CONTRAST TECHNIQUE: Multidetector CT imaging of the abdomen and pelvis was performed following the standard protocol without IV contrast. COMPARISON:  CT 06/02/2016 and MRI abdomen 07/10/2016 FINDINGS: Lower chest: No acute findings. Few stable emphysematous blebs bilaterally. Hepatobiliary: Minimal cholelithiasis. Liver and biliary tree are normal. Pancreas: Normal. Spleen: Normal. Adrenals/Urinary Tract: Adrenal glands are normal. Kidneys are normal in size and demonstrate multiple bilateral cysts. There is a 1.2 cm hyperdense nodule over the lower pole left  kidney slightly larger with Hounsfield unit measurements of 83 compatible with a hemorrhagic cyst. Ureters and bladder are normal. Stomach/Bowel: Stomach and small bowel are normal. Appendix is normal. Colon is within normal. Vascular/Lymphatic: Mild calcified plaque over the abdominal aorta. No adenopathy. Reproductive: Mild prostatic enlargement measuring 6.2 x 6.2 x 6.2 cm. Other: No free fluid or focal inflammatory change. Musculoskeletal: Degenerate change of the spine. Minimal loss of anterior vertebral body height of T12 and L1 unchanged. IMPRESSION: No acute findings in the abdomen/pelvis. Prostatic enlargement measuring 6.2 x 6.2 x 6.2 cm. Multiple bilateral renal cysts. 1.2 cm hemorrhagic cyst over the lower pole left kidney. Minimal cholelithiasis. Aortic Atherosclerosis (ICD10-I70.0). Electronically Signed   By: Marin Olp M.D.   On: 09/23/2018 13:58    EKG:   Atrial fibrillation 53 bpm Second EKG atrial fibrillation 40 bpm  IMPRESSION AND PLAN:   1.  Bradycardia and atrial fibrillation.  Hold metoprolol for now.  Obtain an echocardiogram.  Hold anticoagulation with the patient's hematuria.  Cardiology consultation. 2.  Hematuria and dysuria.  CT scan shows a hemorrhagic cyst.  Urine analysis with  with no nitrites and leukocyte Estrace being negative and 0-5 white blood cells.  Urine culture sent off by ER physician.  Spoke with Dr. Gloriann Loan urology who recommended a post void residual bladder scan to see if he is emptying his bladder okay and empiric Keflex for right now. 3.  Essential hypertension.  Continue usual medications and hold metoprolol 4.  BPH on doxazosin 5.  Chronic kidney disease stage III 6.  Type 2 diabetes mellitus diet-controlled    All the records are reviewed and case discussed with ED provider. Management plans discussed with the patient, family and they are in agreement.  CODE STATUS: Full code  TOTAL TIME TAKING CARE OF THIS PATIENT: 50 minutes.     Loletha Grayer M.D on 09/23/2018 at 3:10 PM  Between 7am to 6pm - Pager - [phone]  After 6pm call admission pager [phone]  [facility] Office  [phone]  CC: Primary care physician; Gaetano Net

## 2018-09-23 NOTE — Consult Note (Signed)
Nathaniel Villanueva is a 85 y.o. male  [id number]  Primary Cardiologist: Neoma Laming Reason for Consultation: Atrial fibrillation with slow ventricular rate  HPI: This 85 year old African-American male with history of hypertension atrial fibrillation hyperlipidemia presented to the hospital with dysuria and was found to have hematuria and CT scan showing hemorrhagic cyst.  Patient was incidentally found to have atrial fibrillation with slow ventricular response rate.   Review of Systems: Denies any chest pain or shortness of breath   Past Medical History:  Diagnosis Date  . Anemia   . Diabetes mellitus without complication ([place])   . Hyperlipidemia   . Hypertension   . Hypoglycemia     (Not in a hospital admission)    . amLODipine  10 mg Oral Daily  . cephALEXin  250 mg Oral Q8H  . [START ON 09/24/2018] doxazosin  4 mg Oral Daily  . [START ON 09/24/2018] lisinopril  20 mg Oral Daily  . [START ON 09/24/2018] pantoprazole  40 mg Oral Daily    Infusions:   No Known Allergies  Social History   Socioeconomic History  . Marital status: Married    Spouse name: Not on file  . Number of children: Not on file  . Years of education: Not on file  . Highest education level: Not on file  Occupational History  . Not on file  Social Needs  . Financial resource strain: Not on file  . Food insecurity:    Worry: Not on file    Inability: Not on file  . Transportation needs:    Medical: Not on file    Non-medical: Not on file  Tobacco Use  . Smoking status: Former Research scientist (life sciences)  . Smokeless tobacco: Never Used  Substance and Sexual Activity  . Alcohol use: No  . Drug use: No  . Sexual activity: Not on file  Lifestyle  . Physical activity:    Days per week: Not on file    Minutes per session: Not on file  . Stress: Not on file  Relationships  . Social connections:    Talks on phone: Not on file    Gets together: Not on file    Attends religious service: Not on file    Active  member of club or organization: Not on file    Attends meetings of clubs or organizations: Not on file    Relationship status: Not on file  . Intimate partner violence:    Fear of current or ex partner: Not on file    Emotionally abused: Not on file    Physically abused: Not on file    Forced sexual activity: Not on file  Other Topics Concern  . Not on file  Social History Narrative  . Not on file    Family History  Problem Relation Age of Onset  . Hypertension Mother   . Prostate cancer Neg Hx   . Bladder Cancer Neg Hx   . Kidney cancer Neg Hx     PHYSICAL EXAM: Vitals:   09/23/18 1300 09/23/18 1500  BP: 135/68 (!) 163/81  Pulse: (!) 46   Resp: 14 13  Temp:    SpO2: 100%     No intake or output data in the 24 hours ending 09/23/18 1547  General:  Well appearing. No respiratory difficulty HEENT: normal Neck: supple. no JVD. Carotids 2+ bilat; no bruits. No lymphadenopathy or thryomegaly appreciated. Cor: PMI nondisplaced. Regular rate & rhythm. No rubs, gallops or murmurs. Lungs: clear Abdomen:  soft, nontender, nondistended. No hepatosplenomegaly. No bruits or masses. Good bowel sounds. Extremities: no cyanosis, clubbing, rash, edema Neuro: alert & oriented x 3, cranial nerves grossly intact. moves all 4 extremities w/o difficulty. Affect pleasant.  ECG: Atrial fibrillation with slow ventricular response rate about 45-50 on the monitor is 56 with right bundle branch block and nonspecific ST-T changes  Results for orders placed or performed during the hospital encounter of 09/23/18 (from the past 24 hour(s))  Urinalysis, Complete w Microscopic     Status: Abnormal   Collection Time: 09/23/18 11:37 AM  Result Value Ref Range   Color, Urine AMBER (A) YELLOW   APPearance CLOUDY (A) CLEAR   Specific Gravity, Urine 1.005 1.005 - 1.030   pH 5.0 5.0 - 8.0   Glucose, UA NEGATIVE NEGATIVE mg/dL   Hgb urine dipstick LARGE (A) NEGATIVE   Bilirubin Urine NEGATIVE NEGATIVE    Ketones, ur NEGATIVE NEGATIVE mg/dL   Protein, ur 30 (A) NEGATIVE mg/dL   Nitrite NEGATIVE NEGATIVE   Leukocytes,Ua NEGATIVE NEGATIVE   RBC / HPF 11-20 0 - 5 RBC/hpf   WBC, UA 0-5 0 - 5 WBC/hpf   Bacteria, UA RARE (A) NONE SEEN   Squamous Epithelial / LPF 0-5 0 - 5   Mucus PRESENT   Basic metabolic panel     Status: Abnormal   Collection Time: 09/23/18 11:37 AM  Result Value Ref Range   Sodium 139 135 - 145 mmol/L   Potassium 4.1 3.5 - 5.1 mmol/L   Chloride 109 98 - 111 mmol/L   CO2 23 22 - 32 mmol/L   Glucose, Bld 146 (H) 70 - 99 mg/dL   BUN 16 8 - 23 mg/dL   Creatinine, Ser 1.38 (H) 0.61 - 1.24 mg/dL   Calcium 9.0 8.9 - 10.3 mg/dL   GFR calc non Af Amer 46 (L) >60 mL/min   GFR calc Af Amer 53 (L) >60 mL/min   Anion gap 7 5 - 15  CBC     Status: Abnormal   Collection Time: 09/23/18 11:37 AM  Result Value Ref Range   WBC 2.9 (L) 4.0 - 10.5 K/uL   RBC 3.63 (L) 4.22 - 5.81 MIL/uL   Hemoglobin 11.9 (L) 13.0 - 17.0 g/dL   HCT 36.3 (L) 39.0 - 52.0 %   MCV 100.0 80.0 - 100.0 fL   MCH 32.8 26.0 - 34.0 pg   MCHC 32.8 30.0 - 36.0 g/dL   RDW 12.2 11.5 - 15.5 %   Platelets 157 150 - 400 K/uL   nRBC 0.0 0.0 - 0.2 %  Troponin I - Add-On to previous collection     Status: None   Collection Time: 09/23/18 11:37 AM  Result Value Ref Range   Troponin I <0.03 <0.03 ng/mL   Ct Renal Stone Study  Result Date: 09/23/2018 CLINICAL DATA:  Bilateral lower pelvic pain.  Dark urine. EXAM: CT ABDOMEN AND PELVIS WITHOUT CONTRAST TECHNIQUE: Multidetector CT imaging of the abdomen and pelvis was performed following the standard protocol without IV contrast. COMPARISON:  CT 06/02/2016 and MRI abdomen 07/10/2016 FINDINGS: Lower chest: No acute findings. Few stable emphysematous blebs bilaterally. Hepatobiliary: Minimal cholelithiasis. Liver and biliary tree are normal. Pancreas: Normal. Spleen: Normal. Adrenals/Urinary Tract: Adrenal glands are normal. Kidneys are normal in size and demonstrate multiple  bilateral cysts. There is a 1.2 cm hyperdense nodule over the lower pole left kidney slightly larger with Hounsfield unit measurements of 83 compatible with a hemorrhagic cyst. Ureters and bladder are  normal. Stomach/Bowel: Stomach and small bowel are normal. Appendix is normal. Colon is within normal. Vascular/Lymphatic: Mild calcified plaque over the abdominal aorta. No adenopathy. Reproductive: Mild prostatic enlargement measuring 6.2 x 6.2 x 6.2 cm. Other: No free fluid or focal inflammatory change. Musculoskeletal: Degenerate change of the spine. Minimal loss of anterior vertebral body height of T12 and L1 unchanged. IMPRESSION: No acute findings in the abdomen/pelvis. Prostatic enlargement measuring 6.2 x 6.2 x 6.2 cm. Multiple bilateral renal cysts. 1.2 cm hemorrhagic cyst over the lower pole left kidney. Minimal cholelithiasis. Aortic Atherosclerosis (ICD10-I70.0). Electronically Signed   By: Marin Olp M.D.   On: 09/23/2018 13:58     ASSESSMENT AND PLAN: Atrial fibrillation with a slow ventricular response rate and hematuria.  Agree with stopping anticoagulants and with holding metoprolol.  Will reevaluate the patient as far as what medication to give for atrial fibrillation right now appears to be clinically stable.  Agree with getting echocardiogram to evaluate left ventricular systolic function.  Deronda Christian A

## 2018-09-24 ENCOUNTER — Observation Stay
Admit: 2018-09-24 | Discharge: 2018-09-24 | Disposition: A | Discharge status: Home / Self Care | Payer: Medicare Other | Attending: Internal Medicine | Admitting: Internal Medicine

## 2018-09-24 LAB — BASIC METABOLIC PANEL
Anion gap: 7 (ref 5–15)
BUN: 13 mg/dL (ref 8–23)
CHLORIDE: 107 mmol/L (ref 98–111)
CO2: 25 mmol/L (ref 22–32)
Calcium: 8.7 mg/dL — ABNORMAL LOW (ref 8.9–10.3)
Creatinine, Ser: 1.48 mg/dL — ABNORMAL HIGH (ref 0.61–1.24)
GFR calc Af Amer: 49 mL/min — ABNORMAL LOW (ref >60)
GFR calc non Af Amer: 42 mL/min — ABNORMAL LOW (ref >60)
Glucose, Bld: 98 mg/dL (ref 70–99)
POTASSIUM: 4.1 mmol/L (ref 3.5–5.1)
SODIUM: 139 mmol/L (ref 135–145)

## 2018-09-24 LAB — ECHOCARDIOGRAM COMPLETE
HEIGHTINCHES: 72 in
WEIGHTICAEL: 3033.6 [oz_av]

## 2018-09-24 LAB — CBC
HEMATOCRIT: 36.1 % — AB (ref 39.0–52.0)
Hemoglobin: 11.7 g/dL — ABNORMAL LOW (ref 13.0–17.0)
MCH: 32.4 pg (ref 26.0–34.0)
MCHC: 32.4 g/dL (ref 30.0–36.0)
MCV: 100 fL (ref 80.0–100.0)
Platelets: 162 10*3/uL (ref 150–400)
RBC: 3.61 MIL/uL — ABNORMAL LOW (ref 4.22–5.81)
RDW: 12 % (ref 11.5–15.5)
WBC: 3.5 10*3/uL — AB (ref 4.0–10.5)
nRBC: 0 % (ref 0.0–0.2)

## 2018-09-24 MED ORDER — ACETAMINOPHEN 325 MG PO TABS: 650.0000 mg | ORAL_TABLET | Freq: Four times a day (QID) | ORAL | Status: DC | PRN

## 2018-09-24 MED ORDER — ATROPINE SULFATE 1 MG/10ML IJ SOSY
0.5000 mg | PREFILLED_SYRINGE | Freq: Once | INTRAMUSCULAR | Status: AC
  Administered 2018-09-24: 0.5 mg via INTRAVENOUS
  Filled 2018-09-24 (×2): qty 10

## 2018-09-24 MED ORDER — CEPHALEXIN 250 MG PO CAPS: 250.0000 mg | ORAL_CAPSULE | Freq: Three times a day (TID) | ORAL | 0 refills | Status: DC

## 2018-09-24 NOTE — Progress Notes (Signed)
Discharge instructions given to patient and daughter at bedside. Patient and daughter verbalized understanding. IV taken out and tele monitor off. Patient going home via family vehicle.

## 2018-09-24 NOTE — Care Management Obs Status (Signed)
[place] NOTIFICATION   Patient Details  Name: Nathaniel Villanueva MRN: [id number] Date of Birth: 05/17/1931   Medicare Observation Status Notification Given:  Yes    Caralee Morea A Ernie Kasler, RN 09/24/2018, 11:09 AM

## 2018-09-24 NOTE — Plan of Care (Signed)
  Problem: Education: Goal: Knowledge of General Education information will improve Description: Including pain rating scale, medication(s)/side effects and non-pharmacologic comfort measures Outcome: Progressing   Problem: Safety: Goal: Ability to remain free from injury will improve Outcome: Progressing   

## 2018-09-24 NOTE — Discharge Instructions (Signed)
Follow-up with primary care physician in 4 to 5 days please repeat CBC during follow-up visit Follow-up with cardiology Dr. Humphrey Rolls in 2 weeks Follow-up with [facility] urology in a week-follow-up on the urine culture and sensitivity

## 2018-09-24 NOTE — Progress Notes (Signed)
SUBJECTIVE: Patient is feeling much better denies any chest pain shortness of breath or dizziness   Vitals:   09/23/18 2127 09/24/18 0003 09/24/18 0428 09/24/18 0732  BP: (!) 150/70 (!) 146/77 (!) 151/79 (!) 165/80  Pulse: (!) 43 (!) 42 (!) 44 (!) 45  Resp: 18 16 18    Temp: 98 F (36.7 C) 98.5 F (36.9 C) 98.7 F (37.1 C) 98.6 F (37 C)  TempSrc: Oral Oral Oral Oral  SpO2: 100% 100% 100% 100%  Weight:   86 kg   Height:        Intake/Output Summary (Last 24 hours) at 09/24/2018 4174 Last data filed at 09/24/2018 0700 Gross per 24 hour  Intake 240 ml  Output 1845 ml  Net -1605 ml    LABS: Basic Metabolic Panel: Recent Labs    09/23/18 1137 09/24/18 0601  NA 139 139  K 4.1 4.1  CL 109 107  CO2 23 25  GLUCOSE 146* 98  BUN 16 13  CREATININE 1.38* 1.48*  CALCIUM 9.0 8.7*   Liver Function Tests: No results for input(s): AST, ALT, ALKPHOS, BILITOT, PROT, ALBUMIN in the last 72 hours. No results for input(s): LIPASE, AMYLASE in the last 72 hours. CBC: Recent Labs    09/23/18 1137 09/24/18 0601  WBC 2.9* 3.5*  HGB 11.9* 11.7*  HCT 36.3* 36.1*  MCV 100.0 100.0  PLT 157 162   Cardiac Enzymes: Recent Labs    09/23/18 1137  TROPONINI <0.03   BNP: Invalid input(s): POCBNP D-Dimer: No results for input(s): DDIMER in the last 72 hours. Hemoglobin A1C: No results for input(s): HGBA1C in the last 72 hours. Fasting Lipid Panel: No results for input(s): CHOL, HDL, LDLCALC, TRIG, CHOLHDL, LDLDIRECT in the last 72 hours. Thyroid Function Tests: No results for input(s): TSH, T4TOTAL, T3FREE, THYROIDAB in the last 72 hours.  Invalid input(s): FREET3 Anemia Panel: No results for input(s): VITAMINB12, FOLATE, FERRITIN, TIBC, IRON, RETICCTPCT in the last 72 hours.   PHYSICAL EXAM General: Well developed, well nourished, in no acute distress HEENT:  Normocephalic and atramatic Neck:  No JVD.  Lungs: Clear bilaterally to auscultation and percussion. Heart: HRRR .  Normal S1 and S2 without gallops or murmurs.  Abdomen: Bowel sounds are positive, abdomen soft and non-tender  Msk:  Back normal, normal gait. Normal strength and tone for age. Extremities: No clubbing, cyanosis or edema.   Neuro: Alert and oriented X 3. Psych:  Good affect, responds appropriately  TELEMETRY: Atrial fibrillation with slow ventricular response rate about 54 bpm  ASSESSMENT AND PLAN: Atrial fibrillation with slow ventricular response rate.  Patient says that the echo was done but apparently still not in the system to be read and will look forward to reading back.  Patient clinically is doing fine.  Metoprolol was stopped and his rate is gradually improving.  Active Problems:   Bradycardia    Nathaniel Villanueva A, MD, [facility] 09/24/2018 9:22 AM

## 2018-09-24 NOTE — Plan of Care (Signed)

## 2018-09-24 NOTE — Progress Notes (Signed)
*  PRELIMINARY RESULTS* Echocardiogram 2D Echocardiogram has been performed.  Nathaniel Villanueva 09/24/2018, 9:19 AM

## 2018-09-24 NOTE — Discharge Summary (Signed)
[facility] [place] NAME: Nathaniel Villanueva    MR#:  [id number]  DATE OF BIRTH:  1930/09/27  DATE OF ADMISSION:  09/23/2018 ADMITTING PHYSICIAN: Loletha Grayer, MD  DATE OF DISCHARGE: 09/24/18  PRIMARY CARE PHYSICIAN: Patient, No Pcp Per    ADMISSION DIAGNOSIS:  Atrial fibrillation with slow ventricular response (HCC) [I48.91] Symptomatic bradycardia [R00.1]  DISCHARGE DIAGNOSIS:  Active Problems:   Bradycardia hematuria  SECONDARY DIAGNOSIS:   Past Medical History:  Diagnosis Date  . Anemia   . Diabetes mellitus without complication ([place])   . Hyperlipidemia   . Hypertension   . Hypoglycemia     HOSPITAL COURSE:   HPI  Nathaniel Villanueva  is a 85 y.o. male with a known history of hypertension diabetes presents with urinary symptoms of burning on urination, urinary frequency and blood in the urine.  He has had some cold chills and some weakness.  In the ER he was found to be in atrial fibrillation and bradycardic with heart rate sometimes dropping down into the 30s.  A CT scan of the abdomen showed a hemorrhagic kidney cyst and enlarged prostate.  Hospitalist services contacted for further evaluation  1.  Bradycardia and atrial fibrillation.   Clinically improved.  Patient denies any symptoms heart rate is around 45 Seen by cardiology Dr. Humphrey Rolls.  Recommending to continue holding beta-blocker and okay to discharge patient from cardiology standpoint. Outpatient follow-up in 2 weeks is recommended Echocardiogram with 60 to 65% ejection fraction   2.  Hematuria and dysuria.  CT scan shows a hemorrhagic cyst.  Urine analysis with  with no nitrites and leukocyte Estrace being negative and 0-5 white blood cells.  Urine culture pending Patient is voiding okay  Dr. Gloriann Loan urology who recommended a post void residual bladder scan to see if he is emptying his bladder okay, no issues with bladder emptying and voiding and empiric Keflex for 1 week.   Follow-up with outpatient [facility] urology in a week  3.  Essential hypertension.  Continue usual medications and hold metoprolol  4.  BPH on doxazosin  5.  Chronic kidney disease stage III-creatinine at his baseline avoid nephrotoxins  6.  Type 2 diabetes mellitus diet-controlled    DISCHARGE CONDITIONS:   STABLE   CONSULTS OBTAINED:  Treatment Team:  Lucas Mallow, MD Dionisio David, MD   PROCEDURES  None   DRUG ALLERGIES:  No Known Allergies  DISCHARGE MEDICATIONS:   Allergies as of 09/24/2018   No Known Allergies     Medication List    STOP taking these medications   metoprolol succinate 100 MG 24 hr tablet Commonly known as:  TOPROL-XL     TAKE these medications   acetaminophen 325 MG tablet Commonly known as:  TYLENOL Take 2 tablets (650 mg total) by mouth every 6 (six) hours as needed for mild pain (or Fever >/= 101).   amLODipine 10 MG tablet Commonly known as:  NORVASC Take 10 mg by mouth daily.   bisacodyl 5 MG EC tablet Commonly known as:  DULCOLAX Take 5 mg by mouth daily as needed.   cephALEXin 250 MG capsule Commonly known as:  KEFLEX Take 1 capsule (250 mg total) by mouth every 8 (eight) hours.   doxazosin 4 MG tablet Commonly known as:  CARDURA Take 4 mg by mouth daily.   lisinopril 20 MG tablet Commonly known as:  PRINIVIL,ZESTRIL Take 20 mg by mouth daily.   pantoprazole 40  MG tablet Commonly known as:  PROTONIX Take 40 mg by mouth daily.        DISCHARGE INSTRUCTIONS:  Follow-up with primary care physician in 4 to 5 days please repeat CBC during follow-up visit Follow-up with cardiology Dr. Humphrey Rolls in 2 weeks Follow-up with [facility] urology in a week-follow-up on the urine culture and sensitivity   DIET:  Cardiac diet  DISCHARGE CONDITION:  Fair  ACTIVITY:  Activity as tolerated  OXYGEN:  Home Oxygen: No.   Oxygen Delivery: room air  DISCHARGE LOCATION:  home   If you experience worsening of  your admission symptoms, develop shortness of breath, life threatening emergency, suicidal or homicidal thoughts you must seek medical attention immediately by calling 911 or calling your MD immediately  if symptoms less severe.  You Must read complete instructions/literature along with all the possible adverse reactions/side effects for all the Medicines you take and that have been prescribed to you. Take any new Medicines after you have completely understood and accpet all the possible adverse reactions/side effects.   Please note  You were cared for by a hospitalist during your hospital stay. If you have any questions about your discharge medications or the care you received while you were in the hospital after you are discharged, you can call the unit and asked to speak with the hospitalist on call if the hospitalist that took care of you is not available. Once you are discharged, your primary care physician will handle any further medical issues. Please note that NO REFILLS for any discharge medications will be authorized once you are discharged, as it is imperative that you return to your primary care physician (or establish a relationship with a primary care physician if you do not have one) for your aftercare needs so that they can reassess your need for medications and monitor your lab values.     Today  Chief Complaint  Patient presents with  . Hematuria   Patient is doing fine.  Heart rate is around 45.  Denies any dizziness or lightheadedness.  Wants to go home.  Okay to discharge patient from cardiology and urology standpoint  ROS:  CONSTITUTIONAL: Denies fevers, chills. Denies any fatigue, weakness.  EYES: Denies blurry vision, double vision, eye pain. EARS, NOSE, THROAT: Denies tinnitus, ear pain, hearing loss. RESPIRATORY: Denies cough, wheeze, shortness of breath.  CARDIOVASCULAR: Denies chest pain, palpitations, edema.  GASTROINTESTINAL: Denies nausea, vomiting, diarrhea,  abdominal pain. Denies bright red blood per rectum. GENITOURINARY: Denies dysuria, hematuria. ENDOCRINE: Denies nocturia or thyroid problems. HEMATOLOGIC AND LYMPHATIC: Denies easy bruising or bleeding. SKIN: Denies rash or lesion. MUSCULOSKELETAL: Denies pain in neck, back, shoulder, knees, hips or arthritic symptoms.  NEUROLOGIC: Denies paralysis, paresthesias.  PSYCHIATRIC: Denies anxiety or depressive symptoms.   VITAL SIGNS:  Blood pressure (!) 165/80, pulse (!) 45, temperature 98.6 F (37 C), temperature source Oral, resp. rate 18, height 6' (1.829 m), weight 86 kg, SpO2 100 %.  I/O:    Intake/Output Summary (Last 24 hours) at 09/24/2018 1325 Last data filed at 09/24/2018 1041 Gross per 24 hour  Intake 240 ml  Output 2345 ml  Net -2105 ml    PHYSICAL EXAMINATION:  GENERAL:  84 y.o.-year-old patient lying in the bed with no acute distress.  EYES: Pupils equal, round, reactive to light and accommodation. No scleral icterus. Extraocular muscles intact.  HEENT: Head atraumatic, normocephalic. Oropharynx and nasopharynx clear.  NECK:  Supple, no jugular venous distention. No thyroid enlargement, no tenderness.  LUNGS: Normal  breath sounds bilaterally, no wheezing, rales,rhonchi or crepitation. No use of accessory muscles of respiration.  CARDIOVASCULAR: S1, S2 normal. No murmurs, rubs, or gallops.  ABDOMEN: Soft, non-tender, non-distended. Bowel sounds present. Marland Kitchen  EXTREMITIES: No pedal edema, cyanosis, or clubbing.  NEUROLOGIC: Awake, alert and oriented x3 sensation intact. Gait not checked.  PSYCHIATRIC: The patient is alert and oriented x 3.  SKIN: No obvious rash, lesion, or ulcer.   DATA REVIEW:   CBC Recent Labs  Lab 09/24/18 0601  WBC 3.5*  HGB 11.7*  HCT 36.1*  PLT 162    Chemistries  Recent Labs  Lab 09/24/18 0601  NA 139  K 4.1  CL 107  CO2 25  GLUCOSE 98  BUN 13  CREATININE 1.48*  CALCIUM 8.7*    Cardiac Enzymes Recent Labs  Lab  09/23/18 1137  [place] <0.03    Microbiology Results  Results for orders placed or performed in visit on 10/01/17  Microscopic Examination     Status: Abnormal   Collection Time: 10/01/17 10:13 AM  Result Value Ref Range Status   WBC, UA None seen 0 - 5 /hpf Final   RBC, UA 0-2 0 - 2 /hpf Final   Epithelial Cells (non renal) None seen 0 - 10 /hpf Final   Mucus, UA Present (A) Not Estab. Final   Bacteria, UA None seen None seen/Few Final    RADIOLOGY:  Ct Renal Stone Study  Result Date: 09/23/2018 CLINICAL DATA:  Bilateral lower pelvic pain.  Dark urine. EXAM: CT ABDOMEN AND PELVIS WITHOUT CONTRAST TECHNIQUE: Multidetector CT imaging of the abdomen and pelvis was performed following the standard protocol without IV contrast. COMPARISON:  CT 06/02/2016 and MRI abdomen 07/10/2016 FINDINGS: Lower chest: No acute findings. Few stable emphysematous blebs bilaterally. Hepatobiliary: Minimal cholelithiasis. Liver and biliary tree are normal. Pancreas: Normal. Spleen: Normal. Adrenals/Urinary Tract: Adrenal glands are normal. Kidneys are normal in size and demonstrate multiple bilateral cysts. There is a 1.2 cm hyperdense nodule over the lower pole left kidney slightly larger with Hounsfield unit measurements of 83 compatible with a hemorrhagic cyst. Ureters and bladder are normal. Stomach/Bowel: Stomach and small bowel are normal. Appendix is normal. Colon is within normal. Vascular/Lymphatic: Mild calcified plaque over the abdominal aorta. No adenopathy. Reproductive: Mild prostatic enlargement measuring 6.2 x 6.2 x 6.2 cm. Other: No free fluid or focal inflammatory change. Musculoskeletal: Degenerate change of the spine. Minimal loss of anterior vertebral body height of T12 and L1 unchanged. IMPRESSION: No acute findings in the abdomen/pelvis. Prostatic enlargement measuring 6.2 x 6.2 x 6.2 cm. Multiple bilateral renal cysts. 1.2 cm hemorrhagic cyst over the lower pole left kidney. Minimal  cholelithiasis. Aortic Atherosclerosis (ICD10-I70.0). Electronically Signed   By: Marin Olp M.D.   On: 09/23/2018 13:58    EKG:   Orders placed or performed during the hospital encounter of 09/23/18  . EKG 12-Lead  . EKG 12-Lead      Management plans discussed with the patient, family and they are in agreement.  CODE STATUS:     Code Status Orders  (From admission, onward)         Start     Ordered   09/23/18 1502  Full code  Continuous     09/23/18 1502        Code Status History    Date Active Date Inactive Code Status Order ID Comments User Context   04/05/2015 1059 04/07/2015 1553 Full Code [id number]  Bettey Costa, MD ED  TOTAL TIME TAKING CARE OF THIS PATIENT:43 minutes.   Note: This dictation was prepared with Dragon dictation along with smaller phrase technology. Any transcriptional errors that result from this process are unintentional.   @[facility] @  on 09/24/2018 at 1:25 PM  Between 7am to 6pm - Pager - [phone]  After 6pm go to [email or web address] - password [email or web address] [email or web address]  Office  [phone]  CC: Primary care physician; Patient, No Pcp Per

## 2018-09-25 LAB — URINE CULTURE: CULTURE: NO GROWTH

## 2018-09-26 ENCOUNTER — Telehealth: Payer: Self-pay | Admitting: Urology

## 2018-09-26 NOTE — Telephone Encounter (Signed)
I have reviewed Nathaniel Villanueva's ED note and we can schedule him for a later appointment as long as he is not passing blood clots or having gross hematuria and is able to urinate and empty his bladder.  If he should develop any of these symptoms prior to his appointment, he needs to contact the office immediately.

## 2018-09-27 ENCOUNTER — Telehealth: Payer: Self-pay | Admitting: Urology

## 2018-09-27 NOTE — Telephone Encounter (Signed)
Contacted patient and advised him of Shannon's message.  Follow up appointment scheduled in [place].  Patient expressed understanding.

## 2018-09-27 NOTE — Telephone Encounter (Signed)
-----   Message from [place], MD sent at 09/24/2018 12:47 PM EDT ----- Please schedule for next available GU f/u for BPH, h/o hematuria with persistent microscopic hematuria, and bosniak 1/2 renal cysts

## 2018-09-27 NOTE — Telephone Encounter (Signed)
App made 

## 2018-10-03 ENCOUNTER — Telehealth: Payer: Self-pay | Admitting: Urology

## 2018-10-03 NOTE — Telephone Encounter (Signed)
Pt is still having blood in his urine and it's weighing on him psychologically, per his daughter.  They told him as long as he's able to urinate and not in any pain, to just keep a check on him.  He is going to schedule a phone visit with [facility] urology.  They will call back to let us know if he wants to cancel upcoming appt.

## 2018-10-28 ENCOUNTER — Ambulatory Visit: Payer: Medicare Other | Admitting: Urology

## 2018-11-07 ENCOUNTER — Ambulatory Visit: Payer: Medicare Other | Admitting: Urology

## 2018-12-12 ENCOUNTER — Ambulatory Visit: Payer: Medicare Other | Admitting: Urology

## 2019-01-18 ENCOUNTER — Other Ambulatory Visit: Payer: Self-pay

## 2019-01-18 DIAGNOSIS — R310 Gross hematuria: Secondary | ICD-10-CM

## 2019-01-22 DIAGNOSIS — N183 Chronic kidney disease, stage 3 unspecified: Secondary | ICD-10-CM | POA: Insufficient documentation

## 2019-01-22 DIAGNOSIS — K5900 Constipation, unspecified: Secondary | ICD-10-CM | POA: Insufficient documentation

## 2019-01-22 DIAGNOSIS — E1142 Type 2 diabetes mellitus with diabetic polyneuropathy: Secondary | ICD-10-CM | POA: Insufficient documentation

## 2019-01-22 DIAGNOSIS — E1121 Type 2 diabetes mellitus with diabetic nephropathy: Secondary | ICD-10-CM | POA: Insufficient documentation

## 2019-01-22 NOTE — Progress Notes (Signed)
01/23/2019 1:35 PM   Nathaniel Villanueva Feb 11, 1931 [id number]  Referring provider: No referring provider defined for this encounter.  Chief Complaint  Patient presents with  . Follow-up    hypervascular prostrate    HPI: Mr. Nathaniel Villanueva is an 85 year old male with a history of hematuria who presents today for follow up.  History of hematuria (high risk) Former smoker.  He underwent a CT abdomen w/o in 05/2016 which noted suspected small gallstone dependently in gallbladder.  Multiple BILATERAL renal cysts bearing from stable in size to minimally larger since 2015.  Largest cyst is a complicated cyst at the mid LEFT kidney 11.5 x 8.3 x 11.1 cm containing a 10 x 8 mm high attenuation mural nodule which is slightly larger than the 10 x 6 mm seen on the previous exam.  Due to minimal enlargement of this mural nodule since previous exams, recommend characterization of this lesion by MR imaging with and without contrast.  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  MRI in 07/2016 noted renal lesions represent benign Bosniak category 1 and category 2 cysts. The area concerning for nodularity actually represents a small complex cyst and does not enhance at all. No worrisome renal lesion is identified.  Cystoscopy in 09/2017 with Dr. Pilar Jarvis revealed a hypervascular prostate.  Non contrast CT in 09/2018 revealed prostatic enlargement measuring 6.2 x 6.2 x 6.2 cm.  Multiple bilateral renal cysts. 1.2 cm hemorrhagic cyst over the lower pole left kidney.  He was seen at [facility] on 10/06/2018 and urine cytology at that visit was negative.   He was also recommended to start on finasteride.   He has taken the finasteride and continues on the medication.  The gross hematuria has stopped since he has been on the finasteride.    PMH: Past Medical History:  Diagnosis Date  . Anemia   . Diabetes mellitus without complication ([place])   .  Hyperlipidemia   . Hypertension   . Hypoglycemia     Surgical History: Past Surgical History:  Procedure Laterality Date  . COLONOSCOPY WITH PROPOFOL N/A 03/04/2015   Procedure: COLONOSCOPY WITH PROPOFOL;  Surgeon: Lollie Sails, MD;  Location[facility] ENDOSCOPY;  Service: Endoscopy;  Laterality: N/A;  . ESOPHAGOGASTRODUODENOSCOPY (EGD) WITH PROPOFOL N/A 03/04/2015   Procedure: ESOPHAGOGASTRODUODENOSCOPY (EGD) WITH PROPOFOL;  Surgeon: Lollie Sails, MD;  Location: [facility] ENDOSCOPY;  Service: Endoscopy;  Laterality: N/A;  . SKIN CANCER EXCISION      Home Medications:  Allergies as of 01/23/2019   No Known Allergies     Medication List       Accurate as of January 23, 2019  1:35 PM. If you have any questions, ask your nurse or doctor.        acetaminophen 325 MG tablet Commonly known as: TYLENOL Take 2 tablets (650 mg total) by mouth every 6 (six) hours as needed for mild pain (or Fever >/= 101).   amLODipine 10 MG tablet Commonly known as: NORVASC Take 10 mg by mouth daily.   bisacodyl 5 MG EC tablet Commonly known as: DULCOLAX Take 5 mg by mouth daily as needed.   cephALEXin 250 MG capsule Commonly known as: KEFLEX Take 1 capsule (250 mg total) by mouth every 8 (eight) hours.   doxazosin 4 MG tablet Commonly known as: CARDURA Take 4 mg by mouth daily.   finasteride 5 MG tablet Commonly known as: PROSCAR Take 5 mg by mouth  daily.   lisinopril 20 MG tablet Commonly known as: ZESTRIL Take 20 mg by mouth daily.   pantoprazole 40 MG tablet Commonly known as: PROTONIX Take 40 mg by mouth daily.       Allergies: No Known Allergies  Family History: Family History  Problem Relation Age of Onset  . Hypertension Mother   . Prostate cancer Neg Hx   . Bladder Cancer Neg Hx   . Kidney cancer Neg Hx     Social History:  reports that he has quit smoking. He has never used smokeless tobacco. He reports that he does not drink alcohol or use drugs.  ROS: UROLOGY  Frequent Urination?: No Hard to postpone urination?: No Burning/pain with urination?: No Get up at night to urinate?: No Leakage of urine?: No Urine stream starts and stops?: No Trouble starting stream?: No Do you have to strain to urinate?: No Blood in urine?: No Urinary tract infection?: No Sexually transmitted disease?: No Injury to kidneys or bladder?: No Painful intercourse?: No Weak stream?: No Erection problems?: No Penile pain?: No  Gastrointestinal Nausea?: No Vomiting?: No Indigestion/heartburn?: No Diarrhea?: No Constipation?: No  Constitutional Fever: No Night sweats?: No Weight loss?: No Fatigue?: No  Skin Skin rash/lesions?: No Itching?: No  Eyes Blurred vision?: No Double vision?: No  Ears/Nose/Throat Sore throat?: No Sinus problems?: No  Hematologic/Lymphatic Swollen glands?: No Easy bruising?: No  Cardiovascular Leg swelling?: No Chest pain?: No  Respiratory Cough?: No Shortness of breath?: No  Endocrine Excessive thirst?: No  Musculoskeletal Back pain?: No Joint pain?: No  Neurological Headaches?: No Dizziness?: No  Psychologic Depression?: No Anxiety?: No  Physical Exam: BP 140/74   Pulse 91   Temp (!) 97.5 F (36.4 C) (Oral)   Resp 18   Ht 6' (1.829 m)   Wt 196 lb 8 oz (89.1 kg)   SpO2 99%   BMI 26.65 kg/m   Constitutional:  Well nourished. Alert and oriented, No acute distress. HEENT: [place] AT, moist mucus membranes.  Trachea midline, no masses. Cardiovascular: No clubbing, cyanosis, or edema. Respiratory: Normal respiratory effort, no increased work of breathing. Neurologic: Grossly intact, no focal deficits, moving all 4 extremities. Psychiatric: Normal mood and affect.  Laboratory Data: Lab Results  Component Value Date   WBC 3.5 (L) 09/24/2018   HGB 11.7 (L) 09/24/2018   HCT 36.1 (L) 09/24/2018   MCV 100.0 09/24/2018   PLT 162 09/24/2018    Lab Results  Component Value Date   CREATININE 1.48 (H)  09/24/2018    No results found for: PSA  No results found for: TESTOSTERONE  No results found for: HGBA1C  No results found for: TSH  No results found for: CHOL, HDL, CHOLHDL, VLDL, LDLCALC  Lab Results  Component Value Date   AST 24 09/07/2015   Lab Results  Component Value Date   ALT 17 09/07/2015   No components found for: ALKALINEPHOPHATASE No components found for: BILIRUBINTOTAL  No results found for: ESTRADIOL  Urinalysis    Component Value Date/Time   COLORURINE AMBER (A) 09/23/2018 1137   APPEARANCEUR CLOUDY (A) 09/23/2018 1137   APPEARANCEUR Clear 10/01/2017 1013   LABSPEC 1.005 09/23/2018 1137   LABSPEC 1.004 01/25/2014 1651   PHURINE 5.0 09/23/2018 1137   GLUCOSEU NEGATIVE 09/23/2018 1137   GLUCOSEU Negative 01/25/2014 1651   HGBUR LARGE (A) 09/23/2018 1137   BILIRUBINUR NEGATIVE 09/23/2018 1137   BILIRUBINUR Negative 10/01/2017 1013   BILIRUBINUR Negative 01/25/2014 1651   KETONESUR NEGATIVE 09/23/2018 1137  PROTEINUR 30 (A) 09/23/2018 1137   NITRITE NEGATIVE 09/23/2018 1137   LEUKOCYTESUR NEGATIVE 09/23/2018 1137   LEUKOCYTESUR Negative 01/25/2014 1651    I have reviewed the labs.  Assessment & Plan:    1. History of hematuria Hematuria work up completed in  09/2017- findings positive for bilateral renal cysts and a hypervascular prostate He has had recent gross hematuria which has stopped since starting the finasteride He has had non-contrast CT's, MRI and a cystoscopy which have not discovered any GU malignancies and a recent urine cytology was negative which is reassuring We discussed the fact that he hadn't had the recommended imaging of a CTU in the past, but he is not wanting to pursue it at this time.  Due to his age and co-morbidities, I find this reasonable.  He understands the risks of missing an urological tumor.   UA today was not given as he felt no reason to look for further hematuria RTC in 6 months for UA - patient to report any  gross hematuria in the interim   He will continue the finasteride   Return in about 6 months (around 07/26/2019) for IPSS, PVR, UA and exam.  These notes generated with voice recognition software. I apologize for typographical errors.  Zara Council, PA-C  [facility] [address]  [place], [place] [address] [phone]

## 2019-01-23 ENCOUNTER — Encounter: Payer: Self-pay | Admitting: Urology

## 2019-01-23 ENCOUNTER — Ambulatory Visit (INDEPENDENT_AMBULATORY_CARE_PROVIDER_SITE_OTHER): Payer: Medicare Other | Admitting: Urology

## 2019-01-23 ENCOUNTER — Other Ambulatory Visit: Payer: Self-pay

## 2019-01-23 VITALS — BP 140/74 | HR 91 | Temp 97.5°F | Resp 18 | Ht 72.0 in | Wt 196.5 lb

## 2019-01-23 DIAGNOSIS — N281 Cyst of kidney, acquired: Secondary | ICD-10-CM | POA: Diagnosis not present

## 2019-01-23 DIAGNOSIS — R310 Gross hematuria: Secondary | ICD-10-CM

## 2019-01-23 DIAGNOSIS — Z87448 Personal history of other diseases of urinary system: Secondary | ICD-10-CM

## 2019-04-17 ENCOUNTER — Emergency Department: Payer: Medicare Other

## 2019-04-17 ENCOUNTER — Emergency Department
Admission: EM | Admit: 2019-04-17 | Discharge: 2019-04-17 | Disposition: A | Discharge status: Home / Self Care | Payer: Medicare Other | Attending: Student | Admitting: Student

## 2019-04-17 ENCOUNTER — Encounter: Payer: Self-pay | Admitting: Emergency Medicine

## 2019-04-17 ENCOUNTER — Other Ambulatory Visit: Payer: Self-pay

## 2019-04-17 DIAGNOSIS — I129 Hypertensive chronic kidney disease with stage 1 through stage 4 chronic kidney disease, or unspecified chronic kidney disease: Secondary | ICD-10-CM | POA: Diagnosis not present

## 2019-04-17 DIAGNOSIS — Z79899 Other long term (current) drug therapy: Secondary | ICD-10-CM | POA: Diagnosis not present

## 2019-04-17 DIAGNOSIS — R0789 Other chest pain: Secondary | ICD-10-CM | POA: Diagnosis not present

## 2019-04-17 DIAGNOSIS — Z87891 Personal history of nicotine dependence: Secondary | ICD-10-CM | POA: Insufficient documentation

## 2019-04-17 DIAGNOSIS — N183 Chronic kidney disease, stage 3 unspecified: Secondary | ICD-10-CM | POA: Insufficient documentation

## 2019-04-17 DIAGNOSIS — E1122 Type 2 diabetes mellitus with diabetic chronic kidney disease: Secondary | ICD-10-CM | POA: Insufficient documentation

## 2019-04-17 DIAGNOSIS — E860 Dehydration: Secondary | ICD-10-CM

## 2019-04-17 DIAGNOSIS — R079 Chest pain, unspecified: Secondary | ICD-10-CM

## 2019-04-17 DIAGNOSIS — R55 Syncope and collapse: Secondary | ICD-10-CM

## 2019-04-17 DIAGNOSIS — N179 Acute kidney failure, unspecified: Secondary | ICD-10-CM

## 2019-04-17 LAB — CBC WITH DIFFERENTIAL/PLATELET
Abs Immature Granulocytes: 0.02 10*3/uL (ref 0.00–0.07)
Basophils Absolute: 0 10*3/uL (ref 0.0–0.1)
Basophils Relative: 0 %
Eosinophils Absolute: 0.1 10*3/uL (ref 0.0–0.5)
Eosinophils Relative: 1 %
HCT: 39.1 % (ref 39.0–52.0)
Hemoglobin: 12.8 g/dL — ABNORMAL LOW (ref 13.0–17.0)
Immature Granulocytes: 1 %
Lymphocytes Relative: 19 %
Lymphs Abs: 0.7 10*3/uL (ref 0.7–4.0)
MCH: 31.8 pg (ref 26.0–34.0)
MCHC: 32.7 g/dL (ref 30.0–36.0)
MCV: 97 fL (ref 80.0–100.0)
Monocytes Absolute: 0.5 10*3/uL (ref 0.1–1.0)
Monocytes Relative: 13 %
Neutro Abs: 2.6 10*3/uL (ref 1.7–7.7)
Neutrophils Relative %: 66 %
Platelets: 170 10*3/uL (ref 150–400)
RBC: 4.03 MIL/uL — ABNORMAL LOW (ref 4.22–5.81)
RDW: 12 % (ref 11.5–15.5)
WBC: 3.9 10*3/uL — ABNORMAL LOW (ref 4.0–10.5)
nRBC: 0 % (ref 0.0–0.2)

## 2019-04-17 LAB — COMPREHENSIVE METABOLIC PANEL
ALT: 19 U/L (ref 0–44)
AST: 28 U/L (ref 15–41)
Albumin: 4.2 g/dL (ref 3.5–5.0)
Alkaline Phosphatase: 60 U/L (ref 38–126)
Anion gap: 7 (ref 5–15)
BUN: 32 mg/dL — ABNORMAL HIGH (ref 8–23)
CO2: 24 mmol/L (ref 22–32)
Calcium: 8.7 mg/dL — ABNORMAL LOW (ref 8.9–10.3)
Chloride: 107 mmol/L (ref 98–111)
Creatinine, Ser: 2.19 mg/dL — ABNORMAL HIGH (ref 0.61–1.24)
GFR calc Af Amer: 30 mL/min — ABNORMAL LOW (ref >60)
GFR calc non Af Amer: 26 mL/min — ABNORMAL LOW (ref >60)
Glucose, Bld: 134 mg/dL — ABNORMAL HIGH (ref 70–99)
Potassium: 4.3 mmol/L (ref 3.5–5.1)
Sodium: 138 mmol/L (ref 135–145)
Total Bilirubin: 0.7 mg/dL (ref 0.3–1.2)
Total Protein: 7.4 g/dL (ref 6.5–8.1)

## 2019-04-17 LAB — URINALYSIS, COMPLETE (UACMP) WITH MICROSCOPIC
Bacteria, UA: NONE SEEN
Bilirubin Urine: NEGATIVE
Glucose, UA: NEGATIVE mg/dL
Hgb urine dipstick: NEGATIVE
Ketones, ur: NEGATIVE mg/dL
Leukocytes,Ua: NEGATIVE
Nitrite: NEGATIVE
Protein, ur: NEGATIVE mg/dL
Specific Gravity, Urine: 1.009 (ref 1.005–1.030)
Squamous Epithelial / LPF: NONE SEEN (ref 0–5)
pH: 5 (ref 5.0–8.0)

## 2019-04-17 MED ORDER — SODIUM CHLORIDE 0.9 % IV BOLUS
1000.0000 mL | Freq: Once | INTRAVENOUS | Status: AC
  Administered 2019-04-17: 1000 mL via INTRAVENOUS

## 2019-04-17 NOTE — ED Notes (Signed)
AAOx3.  Skin warm and dry.  NAD 

## 2019-04-17 NOTE — ED Provider Notes (Signed)
[facility] Emergency Department Provider Note  ____________________________________________   First MD Initiated Contact with Patient 04/17/19 [phone]     (approximate)  I have reviewed the triage vital signs and the nursing notes.  History  Chief Complaint Loss of Consciousness    HPI JORDAAN CURET is a 85 y.o. male with hx of DM, HTN, syncope, bradycardia, atrial fibrillation (not on anticoagulation), CKD who presents emergency department for a syncopal episode.  Patient states this morning he was using the restroom having a bowel movement (after taking a laxative) and the next thing he remembers he was waking up on the floor.  He states when he woke up he was laying on his left side. He denies any prolonged down time, this all happened this morning. He complains of some left lateral chest wall/rib pain. Mild in severity and does not radiate. He states prior to using the restroom he was feeling at his baseline, denies any headache, visual changes, chest pain, palpitations.  He denies any recent illnesses.  On arrival to the emergency department aside from his left-sided rib pain he is feeling at baseline.   Past Medical Hx Past Medical History:  Diagnosis Date  . Anemia   . Diabetes mellitus without complication ([place])   . Hyperlipidemia   . Hypertension   . Hypoglycemia     Problem List Patient Active Problem List   Diagnosis Date Noted  . CKD (chronic kidney disease) stage 3, GFR 30-59 ml/min 01/22/2019  . Constipation 01/22/2019  . Diabetic peripheral neuropathy ([place]) 01/22/2019  . Microalbuminuric diabetic nephropathy ([place]) 01/22/2019  . Bradycardia 09/23/2018  . Type 2 diabetes mellitus with stage 3 chronic kidney disease, without long-term current use of insulin ([place]) 09/27/2015  . Anxiety, mild 08/30/2015  . Syncope 04/05/2015  . Vitamin D deficiency 05/10/2014  . Hypoglycemia associated with diabetes ([place]) 02/01/2014  . Microalbuminuria  02/01/2014  . Anemia 01/02/2014  . Neck pain 01/02/2014  . HTN (hypertension) 01/02/2014  . Hypercholesterolemia 01/02/2014    Past Surgical Hx Past Surgical History:  Procedure Laterality Date  . COLONOSCOPY WITH PROPOFOL N/A 03/04/2015   Procedure: COLONOSCOPY WITH PROPOFOL;  Surgeon: Lollie Sails, MD;  Location[facility] ENDOSCOPY;  Service: Endoscopy;  Laterality: N/A;  . ESOPHAGOGASTRODUODENOSCOPY (EGD) WITH PROPOFOL N/A 03/04/2015   Procedure: ESOPHAGOGASTRODUODENOSCOPY (EGD) WITH PROPOFOL;  Surgeon: Lollie Sails, MD;  Location: [facility] ENDOSCOPY;  Service: Endoscopy;  Laterality: N/A;  . SKIN CANCER EXCISION      Medications Prior to Admission medications   Medication Sig Start Date End Date Taking? Authorizing Provider  acetaminophen (TYLENOL) 325 MG tablet Take 2 tablets (650 mg total) by mouth every 6 (six) hours as needed for mild pain (or Fever >/= 101). 09/24/18   Gouru, Aruna, MD  amLODipine (NORVASC) 10 MG tablet Take 10 mg by mouth daily.    [provider]  bisacodyl (DULCOLAX) 5 MG EC tablet Take 5 mg by mouth daily as needed.    [provider]  cephALEXin (KEFLEX) 250 MG capsule Take 1 capsule (250 mg total) by mouth every 8 (eight) hours. Patient not taking: Reported on 01/23/2019 09/24/18   Nicholes Mango, MD  doxazosin (CARDURA) 4 MG tablet Take 4 mg by mouth daily.    [provider]  finasteride (PROSCAR) 5 MG tablet Take 5 mg by mouth daily.    [provider]  lisinopril (PRINIVIL,ZESTRIL) 20 MG tablet Take 20 mg by mouth daily.    [provider]  pantoprazole (PROTONIX) 40 MG tablet Take 40 mg by mouth daily.    [provider]    Allergies Patient has no known allergies.  Family Hx Family History  Problem Relation Age of Onset  . Hypertension Mother   . Prostate cancer Neg Hx   . Bladder Cancer Neg Hx   . Kidney cancer Neg Hx     Social Hx Social History   Tobacco Use  . Smoking status:  Former Research scientist (life sciences)  . Smokeless tobacco: Never Used  Substance Use Topics  . Alcohol use: No  . Drug use: No     Review of Systems  Constitutional: Negative for fever, chills. Eyes: Negative for visual changes. ENT: Negative for sore throat. Cardiovascular: Negative for chest pain. Respiratory: Negative for shortness of breath. Gastrointestinal: Negative for nausea, vomiting.  Genitourinary: Negative for dysuria. Musculoskeletal: + L sided chest wall pain Skin: Negative for rash. Neurological: + syncope   Physical Exam  Vital Signs: ED Triage Vitals  Enc Vitals Group     BP 04/17/19 0820 (!) 147/79     Pulse Rate 04/17/19 0820 (!) 55     Resp 04/17/19 0820 15     Temp 04/17/19 0820 98.1 F (36.7 C)     Temp Source 04/17/19 0820 Oral     SpO2 04/17/19 0820 99 %     Weight 04/17/19 0822 196 lb 6.9 oz (89.1 kg)     Height 04/17/19 0822 6' (1.829 m)     Head Circumference --      Peak Flow --      Pain Score 04/17/19 0821 5     Pain Loc --      Pain Edu? --      Excl. in [place]? --     Constitutional: Alert and oriented.  Head: Normocephalic. Atraumatic. Eyes: Conjunctivae clear. Sclera anicteric. Nose: No congestion. No rhinorrhea. Mouth/Throat: Mucous membranes are moist.  Neck: No stridor.  No midline C-spine tenderness.  Full range of motion without discomfort. Cardiovascular: Bradycardic, irregular.  Extremities well perfused. Respiratory: Normal respiratory effort.  Lungs CTAB. Chest: Stable, no crepitance.  Mild tenderness to palpation along the left lateral inferior chest wall. Gastrointestinal: Soft. Non-tender throughout to deep palpation. Non-distended.  Pelvis: Stable and nontender with AP and lateral compression. Musculoskeletal: No lower extremity edema. No deformities.  Full range of motion to bilateral shoulders, elbows, wrists, hips, knees, ankles. Back: No midline C/T/L-spine tenderness.  No step-offs or deformities. Neurologic:  Normal speech and  language. No gross focal neurologic deficits are appreciated.  Skin: Skin is warm, dry and intact.  No lacerations, ecchymosis. Psychiatric: Mood and affect are appropriate for situation.  EKG  Personally reviewed.   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 No STEMI    Radiology  CT head: negative  CXR: negative  X-ray ribs: negative   Procedures  Procedure(s) performed (including critical care):  Procedures   Initial Impression / Assessment and Plan / ED Course  85 y.o. male who presents to the ED for syncopal episode, as above.  Ddx: arrhythmia, dehydration, electrolyte abnormality, vasovagal in the setting of bowel movement  Plan: labs, imaging (CT head given syncope, XR ribs/chest given pain to r/o fracture)  Labs reveal mild AKI (Cr 2.19 from baseline ~1.4). Receiving IVF bolus. No anemia. Urine and imaging negative.  No new acute ischemic changes or acute arrhythmia on EKG. Suspect  likely vasovagal syncope + dehydration. He feels well after fluids, tolerated PO. As such will plan for discharge, advised adequate hydration and healthy diet and outpatient follow up. Patient agreeable.   Final Clinical Impression(s) / ED Diagnosis  Syncope Chest wall pain    Note:  This document was prepared using Dragon voice recognition software and may include unintentional dictation errors.   Lilia Pro., MD 04/17/19 [phone]

## 2019-04-17 NOTE — ED Triage Notes (Addendum)
Arrives via [facility].  Patient took a laxative last night and while sitting on the commode, patient 'woke up on the floor'.  Now c/o LUQ abdominal pain.  Painful to palpation.  Per EMS  VSS  CBG:  133.

## 2019-04-17 NOTE — Discharge Instructions (Addendum)
Thank you for letting us take care of you in the emergency department today.   Please continue to eat a healthy diet and be sure to stay well hydrated.  Please follow up with: - Your primary care doctor to review your ER visit and follow up on your symptoms.   Please return to the ER for any new or worsening symptoms.

## 2019-07-31 ENCOUNTER — Encounter: Payer: Self-pay | Admitting: Urology

## 2019-07-31 ENCOUNTER — Other Ambulatory Visit: Payer: Self-pay

## 2019-07-31 ENCOUNTER — Other Ambulatory Visit
Admission: RE | Admit: 2019-07-31 | Discharge: 2019-07-31 | Disposition: A | Discharge status: Home / Self Care | Payer: Medicare Other | Attending: Urology | Admitting: Urology

## 2019-07-31 ENCOUNTER — Ambulatory Visit (INDEPENDENT_AMBULATORY_CARE_PROVIDER_SITE_OTHER): Payer: Medicare Other | Admitting: Urology

## 2019-07-31 VITALS — BP 139/70 | HR 68 | Ht 72.0 in | Wt 190.0 lb

## 2019-07-31 DIAGNOSIS — Z87448 Personal history of other diseases of urinary system: Secondary | ICD-10-CM | POA: Diagnosis present

## 2019-07-31 LAB — URINALYSIS, COMPLETE (UACMP) WITH MICROSCOPIC
Bacteria, UA: NONE SEEN
Bilirubin Urine: NEGATIVE
Glucose, UA: NEGATIVE mg/dL
Hgb urine dipstick: NEGATIVE
Ketones, ur: NEGATIVE mg/dL
Leukocytes,Ua: NEGATIVE
Nitrite: NEGATIVE
Protein, ur: NEGATIVE mg/dL
Specific Gravity, Urine: 1.015 (ref 1.005–1.030)
Squamous Epithelial / HPF: NONE SEEN (ref 0–5)
pH: 5 (ref 5.0–8.0)

## 2019-07-31 LAB — BLADDER SCAN AMB NON-IMAGING

## 2019-07-31 NOTE — Progress Notes (Signed)
07/31/2019 9:22 AM   Nathaniel Villanueva 1930-10-03 [id number]  Referring provider: No referring provider defined for this encounter.  Chief Complaint  Patient presents with  . Hematuria    follow up    HPI: Nathaniel Villanueva is an 86 year old male with a history of hematuria who presents today for follow up.  History of hematuria (high risk) Former smoker.  He underwent a CT abdomen w/o in 05/2016 which noted suspected small gallstone dependently in gallbladder.  Multiple BILATERAL renal cysts bearing from stable in size to minimally larger since 2015.  Largest cyst is a complicated cyst at the mid LEFT kidney 11.5 x 8.3 x 11.1 cm containing a 10 x 8 mm high attenuation mural nodule which is slightly larger than the 10 x 6 mm seen on the previous exam.  Due to minimal enlargement of this mural nodule since previous exams, recommend characterization of this lesion by MR imaging with and without contrast.  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  MRI in 07/2016 noted renal lesions represent benign Bosniak category 1 and category 2 cysts. The area concerning for nodularity actually represents a small complex cyst and does not enhance at all. No worrisome renal lesion is identified.  Cystoscopy in 09/2017 with Dr. Pilar Jarvis revealed a hypervascular prostate.  Non contrast CT in 09/2018 revealed prostatic enlargement measuring 6.2 x 6.2 x 6.2 cm.  Multiple bilateral renal cysts. 1.2 cm hemorrhagic cyst over the lower pole left kidney.  He was [facility] at [facility] on 10[facility]2018 and urine cytology at that visit was negative.   He was also recommended to start on finasteride.   He has taken the finasteride and continues on the medication.  The gross hematuria has stopped since he has been on the finasteride.  He would not provide an UA at today's visit.  He stated he will bring it back over here later.    I PSS 1/1     PVR 102 mL    IPSS     Row Name 07/31/19 0900         International Prostate Symptom Score   How often have you had the sensation of not emptying your bladder?  Not at All     How often have you had to urinate less than every two hours?  Not at All     How often have you found you stopped and started again several times when you urinated?  Not at All     How often have you found it difficult to postpone urination?  Not at All     How often have you had a weak urinary stream?  Not at All     How often have you had to strain to start urination?  Not at All     How many times did you typically get up at night to urinate?  1 Time     Total IPSS Score  1       Quality of Life due to urinary symptoms   If you were to spend the rest of your life with your urinary condition just the way it is now how would you feel about that?  Pleased        Score:  1-7 Mild 8-19 Moderate 20-35 Severe  PMH: Past Medical History:  Diagnosis Date  . Anemia   . Diabetes mellitus without complication ([place])   . Hyperlipidemia   .  Hypertension   . Hypoglycemia     Surgical History: Past Surgical History:  Procedure Laterality Date  . COLONOSCOPY WITH PROPOFOL N/A 03/04/2015   Procedure: COLONOSCOPY WITH PROPOFOL;  Surgeon: Lollie Sails, MD;  Location: [facility] ENDOSCOPY;  Service: Endoscopy;  Laterality: N/A;  . ESOPHAGOGASTRODUODENOSCOPY (EGD) WITH PROPOFOL N/A 03/04/2015   Procedure: ESOPHAGOGASTRODUODENOSCOPY (EGD) WITH PROPOFOL;  Surgeon: Lollie Sails, MD;  Location: [facility] ENDOSCOPY;  Service: Endoscopy;  Laterality: N/A;  . SKIN CANCER EXCISION      Home Medications:  Allergies as of 07/31/2019   No Known Allergies     Medication List       Accurate as of July 31, 2019  9:22 AM. If you have any questions, ask your nurse or doctor.        STOP taking these medications   acetaminophen 325 MG tablet Commonly known as: TYLENOL Stopped by: Ilia Engelbert, PA-C   bisacodyl 5 MG EC tablet Commonly  known as: DULCOLAX Stopped by: Leni Pankonin, PA-C   cephALEXin 250 MG capsule Commonly known as: KEFLEX Stopped by: Kumiko Fishman, PA-C     TAKE these medications   amLODipine 10 MG tablet Commonly known as: NORVASC Take 10 mg by mouth daily.   doxazosin 4 MG tablet Commonly known as: CARDURA Take 4 mg by mouth daily.   finasteride 5 MG tablet Commonly known as: PROSCAR Take 5 mg by mouth daily.   lisinopril 20 MG tablet Commonly known as: ZESTRIL Take 20 mg by mouth daily.   pantoprazole 40 MG tablet Commonly known as: PROTONIX Take 40 mg by mouth daily.       Allergies: No Known Allergies  Family History: Family History  Problem Relation Age of Onset  . Hypertension Mother   . Prostate cancer Neg Hx   . Bladder Cancer Neg Hx   . Kidney cancer Neg Hx     Social History:  reports that he has quit smoking. He has never used smokeless tobacco. He reports that he does not drink alcohol or use drugs.  ROS: UROLOGY Frequent Urination?: No Hard to postpone urination?: No Burning/pain with urination?: No Get up at night to urinate?: No Leakage of urine?: No Urine stream starts and stops?: No Trouble starting stream?: No Do you have to strain to urinate?: No Blood in urine?: No Urinary tract infection?: No Sexually transmitted disease?: No Injury to kidneys or bladder?: No Painful intercourse?: No Weak stream?: No Erection problems?: No Penile pain?: No  Gastrointestinal Nausea?: No Vomiting?: No Indigestion/heartburn?: No Diarrhea?: No Constipation?: Yes  Constitutional Fever: No Night sweats?: No Weight loss?: No Fatigue?: No  Skin Skin rash/lesions?: No Itching?: No  Eyes Blurred vision?: No Double vision?: Yes  Ears/Nose/Throat Sore throat?: No Sinus problems?: No  Hematologic/Lymphatic Swollen glands?: No Easy bruising?: No  Cardiovascular Leg swelling?: No Chest pain?: No  Respiratory Cough?: No Shortness of  breath?: No  Endocrine Excessive thirst?: No  Musculoskeletal Back pain?: No Joint pain?: Yes  Neurological Headaches?: No Dizziness?: No  Psychologic Depression?: No Anxiety?: No  Physical Exam: BP 139/70   Pulse 68   Ht 6' (1.829 m)   Wt 190 lb (86.2 kg)   BMI 25.77 kg/m   Constitutional:  Well nourished. Alert and oriented, No acute distress. HEENT: [place] AT, mask in place.  Trachea midline, no masses. Cardiovascular: No clubbing, cyanosis, or edema. Respiratory: Normal respiratory effort, no increased work of breathing. GI: Abdomen is soft, non tender, non distended, no abdominal masses. Liver  and spleen not palpable.  No hernias appreciated.  Stool sample for occult testing is not indicated.   GU: No CVA tenderness.  No bladder fullness or masses.  Patient with uncircumcised phallus.   Foreskin easily retracted  Urethral meatus is patent.  No penile discharge. No penile lesions or rashes. Scrotum without lesions, cysts, rashes and/or edema.  Testicles are located scrotally bilaterally. No masses are appreciated in the testicles. Left and right epididymis are normal. Rectal: Not indicated due to advanced age Skin: No rashes, bruises or suspicious lesions. Lymph: No inguinal adenopathy. Neurologic: Grossly intact, no focal deficits, moving all 4 extremities. Psychiatric: Normal mood and affect.  Laboratory Data: Lab Results  Component Value Date   WBC 3.9 (L) 04/17/2019   HGB 12.8 (L) 04/17/2019   HCT 39.1 04/17/2019   MCV 97.0 04/17/2019   PLT 170 04/17/2019    Lab Results  Component Value Date   CREATININE 2.19 (H) 04/17/2019    No results found for: PSA  No results found for: TESTOSTERONE  No results found for: HGBA1C  No results found for: TSH  No results found for: CHOL, HDL, CHOLHDL, VLDL, LDLCALC  Lab Results  Component Value Date   AST 28 04/17/2019   Lab Results  Component Value Date   ALT 19 04/17/2019   No components found for:  ALKALINEPHOPHATASE No components found for: BILIRUBINTOTAL  No results found for: ESTRADIOL  Urinalysis Component     Latest Ref Rng & Units 07/31/2019  Color, Urine     YELLOW YELLOW  Appearance     CLEAR CLEAR  Specific Gravity, Urine     1.005 - 1.030 1.015  pH     5.0 - 8.0 5.0  Glucose, UA     NEGATIVE mg/dL NEGATIVE  Hgb urine dipstick     NEGATIVE NEGATIVE  Bilirubin Urine     NEGATIVE NEGATIVE  Ketones, ur     NEGATIVE mg/dL NEGATIVE  Protein     NEGATIVE mg/dL NEGATIVE  Nitrite     NEGATIVE NEGATIVE  Leukocytes,Ua     NEGATIVE NEGATIVE  Squamous Epithelial / LPF     0 - 5 NONE SEEN  WBC, UA     0 - 5 WBC/hpf 0-5  RBC / HPF     0 - 5 RBC/hpf 0-5  Bacteria, UA     NONE SEEN NONE SEEN  Mucus      PRESENT  Hyaline Casts, UA      PRESENT    I have reviewed the labs.  Assessment & Plan:    1. History of hematuria Hematuria work up completed in  09/2017- findings positive for bilateral renal cysts and a hypervascular prostate He has had recent gross hematuria which has stopped since starting the finasteride He has had non-contrast CT's, MRI and a cystoscopy which have not discovered any GU malignancies and a recent urine cytology was negative which is reassuring We discussed the fact that he hadn't had the recommended imaging of a CTU in the past, but he is not wanting to pursue it at this time.  Due to his age and co-morbidities, I find this reasonable.  He understands the risks of missing an urological tumor.   UA today was not given as he felt no reason to look for further hematuria RTC in 6 months for UA - patient to report any gross hematuria in the interim   He will continue the finasteride   Return in about 1 year (around 07/30/2020)  for UA and office visit .  These notes generated with voice recognition software. I apologize for typographical errors.  Zara Council, PA-C  [facility] [address]  [place], [place] [address] [phone]

## 2019-11-03 IMAGING — CR DG RIBS 2V*L*
1 series · 4 of 4 positions shown · non-contrast
Comparison: PA and lateral chest 04/17/2019.

CLINICAL DATA: Left upper quadrant pain since the patient fell off
a toilet last night. Initial encounter.

EXAM:
LEFT RIBS - 2 VIEW

[Series 1: w ribs ap upper left · 0.14mm/px · 4 of 4 slices shown]
[im 1/4]
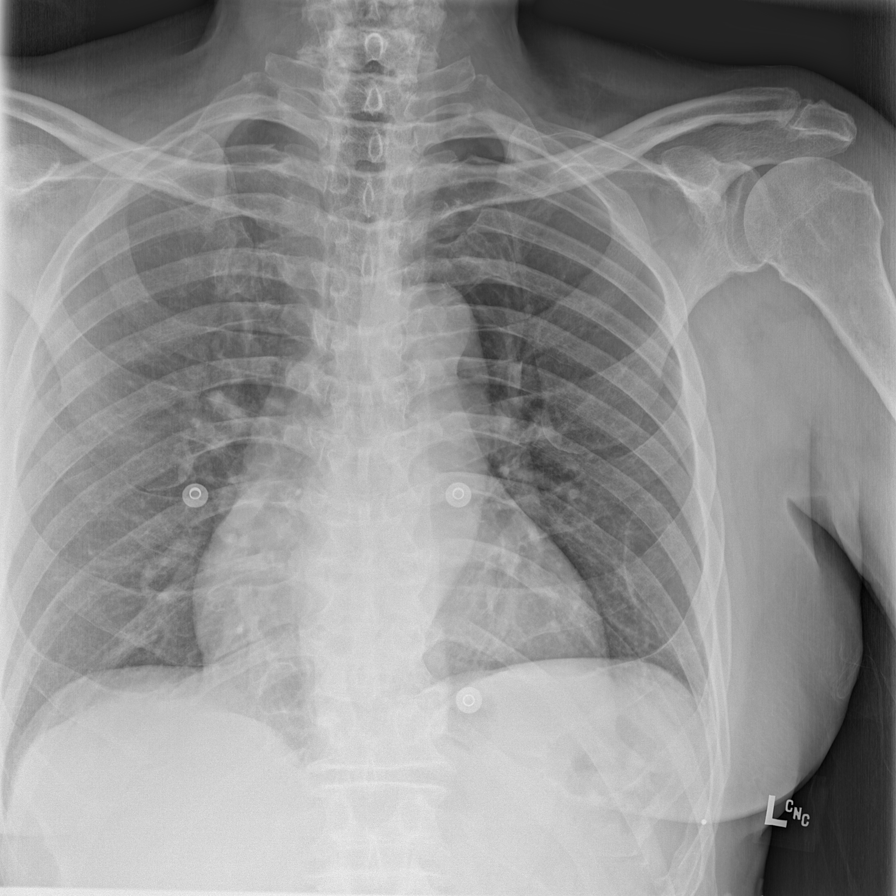
[im 2/4]
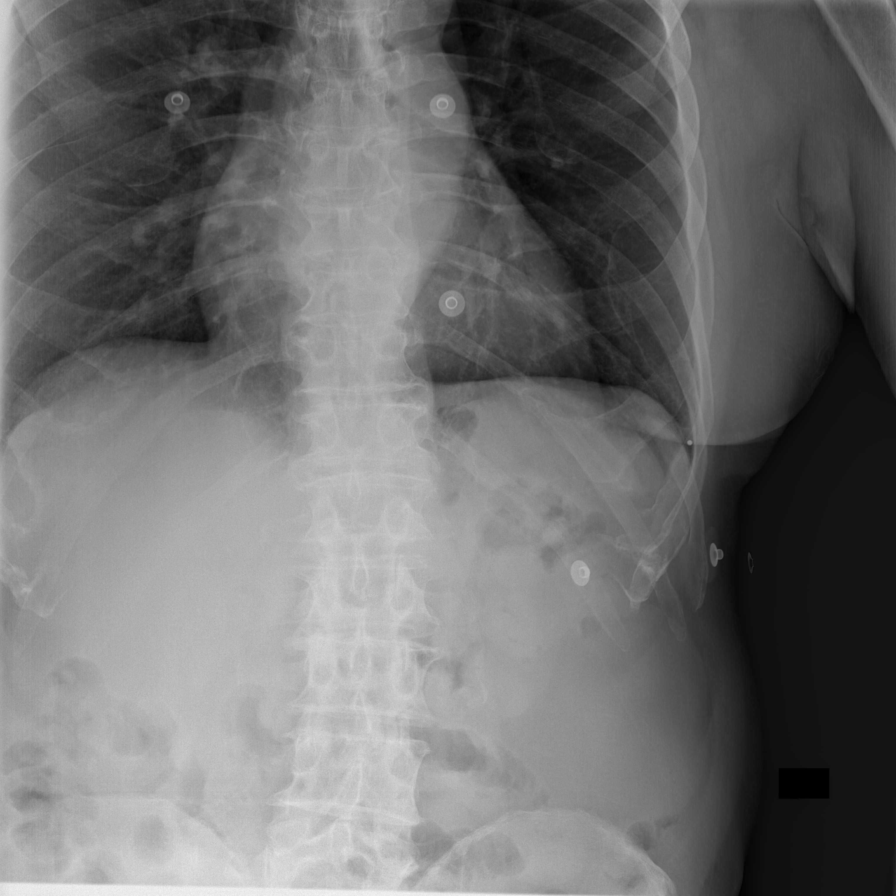
[im 3/4]
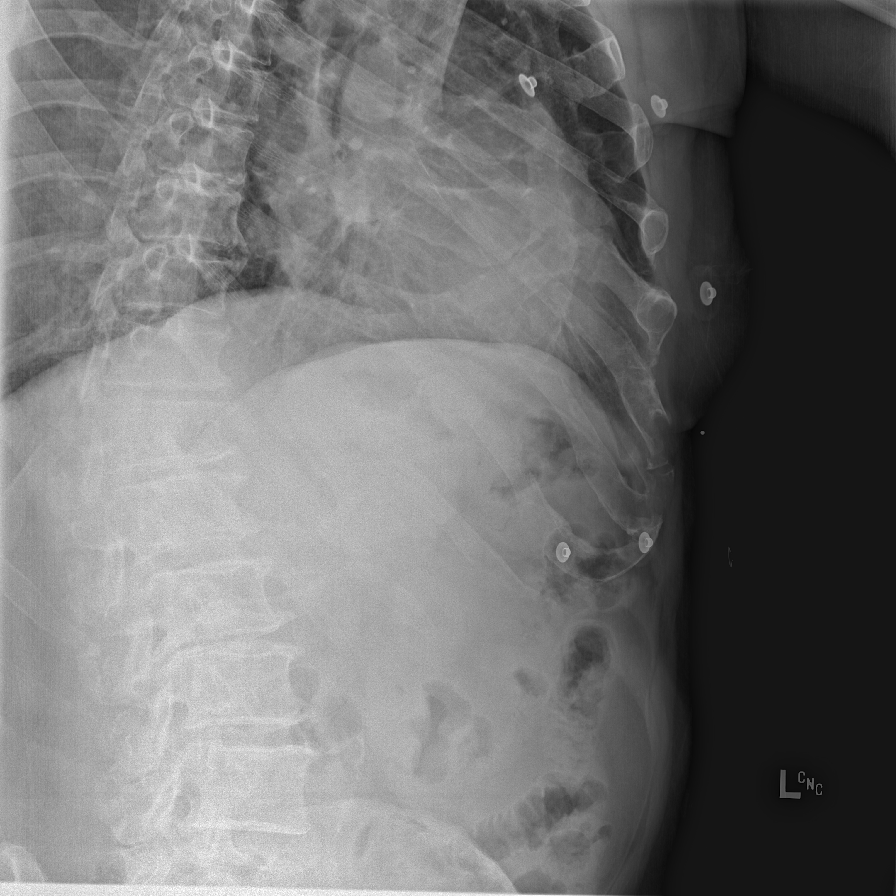
[im 4/4]
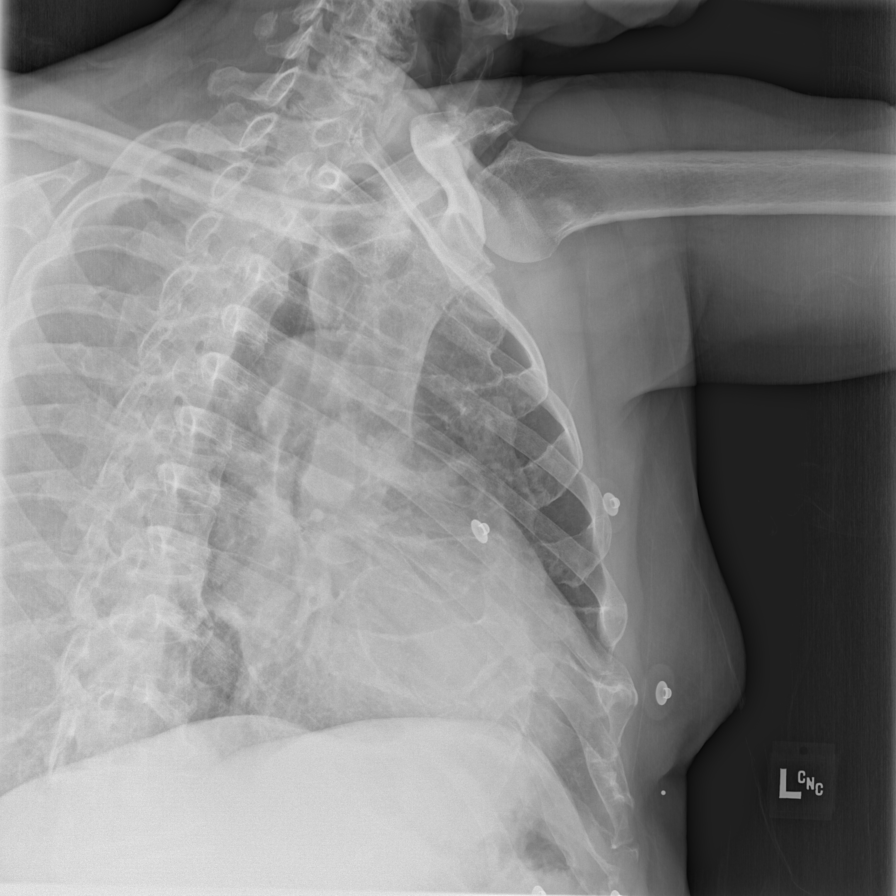

[4 of 4 positions shown; findings below may reference images not displayed]

FINDINGS: Lungs clear. No pneumothorax or pleural fluid. Heart size normal. No
fracture.
IMPRESSION: Negative for fracture.  No acute disease.

## 2019-12-22 ENCOUNTER — Other Ambulatory Visit: Payer: Self-pay | Admitting: Orthopedic Surgery

## 2019-12-22 DIAGNOSIS — G8929 Other chronic pain: Secondary | ICD-10-CM

## 2019-12-22 DIAGNOSIS — M4807 Spinal stenosis, lumbosacral region: Secondary | ICD-10-CM

## 2020-01-09 ENCOUNTER — Ambulatory Visit
Admission: RE | Admit: 2020-01-09 | Discharge: 2020-01-09 | Disposition: A | Discharge status: Home / Self Care | Payer: Medicare Other | Source: Ambulatory Visit | Attending: Orthopedic Surgery | Admitting: Orthopedic Surgery

## 2020-01-09 ENCOUNTER — Other Ambulatory Visit: Payer: Self-pay

## 2020-01-09 DIAGNOSIS — G8929 Other chronic pain: Secondary | ICD-10-CM | POA: Insufficient documentation

## 2020-01-09 DIAGNOSIS — M4807 Spinal stenosis, lumbosacral region: Secondary | ICD-10-CM | POA: Insufficient documentation

## 2020-01-09 DIAGNOSIS — M5441 Lumbago with sciatica, right side: Secondary | ICD-10-CM | POA: Insufficient documentation

## 2020-03-19 DIAGNOSIS — I6523 Occlusion and stenosis of bilateral carotid arteries: Secondary | ICD-10-CM

## 2020-03-19 DIAGNOSIS — I739 Peripheral vascular disease, unspecified: Secondary | ICD-10-CM

## 2020-07-09 ENCOUNTER — Other Ambulatory Visit: Payer: Self-pay

## 2020-07-09 ENCOUNTER — Encounter: Payer: Self-pay | Admitting: Emergency Medicine

## 2020-07-09 ENCOUNTER — Ambulatory Visit
Admission: EM | Admit: 2020-07-09 | Discharge: 2020-07-10 | Disposition: A | Discharge status: Home / Self Care | Payer: Medicare Other | Attending: Internal Medicine | Admitting: Internal Medicine

## 2020-07-09 DIAGNOSIS — R001 Bradycardia, unspecified: Secondary | ICD-10-CM | POA: Diagnosis present

## 2020-07-09 DIAGNOSIS — Z85828 Personal history of other malignant neoplasm of skin: Secondary | ICD-10-CM | POA: Diagnosis not present

## 2020-07-09 DIAGNOSIS — K5900 Constipation, unspecified: Secondary | ICD-10-CM | POA: Insufficient documentation

## 2020-07-09 DIAGNOSIS — Z8249 Family history of ischemic heart disease and other diseases of the circulatory system: Secondary | ICD-10-CM | POA: Insufficient documentation

## 2020-07-09 DIAGNOSIS — K2970 Gastritis, unspecified, without bleeding: Secondary | ICD-10-CM | POA: Diagnosis not present

## 2020-07-09 DIAGNOSIS — Z86010 Personal history of colonic polyps: Secondary | ICD-10-CM | POA: Insufficient documentation

## 2020-07-09 DIAGNOSIS — Z7901 Long term (current) use of anticoagulants: Secondary | ICD-10-CM | POA: Diagnosis not present

## 2020-07-09 DIAGNOSIS — I4819 Other persistent atrial fibrillation: Secondary | ICD-10-CM | POA: Diagnosis present

## 2020-07-09 DIAGNOSIS — K219 Gastro-esophageal reflux disease without esophagitis: Secondary | ICD-10-CM | POA: Diagnosis not present

## 2020-07-09 DIAGNOSIS — D500 Iron deficiency anemia secondary to blood loss (chronic): Secondary | ICD-10-CM | POA: Insufficient documentation

## 2020-07-09 DIAGNOSIS — K921 Melena: Secondary | ICD-10-CM

## 2020-07-09 DIAGNOSIS — Z888 Allergy status to other drugs, medicaments and biological substances status: Secondary | ICD-10-CM | POA: Diagnosis not present

## 2020-07-09 DIAGNOSIS — I4892 Unspecified atrial flutter: Secondary | ICD-10-CM | POA: Diagnosis not present

## 2020-07-09 DIAGNOSIS — E785 Hyperlipidemia, unspecified: Secondary | ICD-10-CM | POA: Insufficient documentation

## 2020-07-09 DIAGNOSIS — E7800 Pure hypercholesterolemia, unspecified: Secondary | ICD-10-CM | POA: Insufficient documentation

## 2020-07-09 DIAGNOSIS — K2100 Gastro-esophageal reflux disease with esophagitis, without bleeding: Secondary | ICD-10-CM | POA: Diagnosis not present

## 2020-07-09 DIAGNOSIS — E1142 Type 2 diabetes mellitus with diabetic polyneuropathy: Secondary | ICD-10-CM | POA: Diagnosis not present

## 2020-07-09 DIAGNOSIS — N189 Chronic kidney disease, unspecified: Secondary | ICD-10-CM

## 2020-07-09 DIAGNOSIS — K922 Gastrointestinal hemorrhage, unspecified: Secondary | ICD-10-CM | POA: Diagnosis not present

## 2020-07-09 DIAGNOSIS — Z79899 Other long term (current) drug therapy: Secondary | ICD-10-CM | POA: Insufficient documentation

## 2020-07-09 DIAGNOSIS — N183 Chronic kidney disease, stage 3 unspecified: Secondary | ICD-10-CM | POA: Diagnosis present

## 2020-07-09 DIAGNOSIS — I10 Essential (primary) hypertension: Secondary | ICD-10-CM | POA: Diagnosis present

## 2020-07-09 DIAGNOSIS — E1122 Type 2 diabetes mellitus with diabetic chronic kidney disease: Secondary | ICD-10-CM | POA: Diagnosis not present

## 2020-07-09 DIAGNOSIS — N1832 Chronic kidney disease, stage 3b: Secondary | ICD-10-CM

## 2020-07-09 DIAGNOSIS — D631 Anemia in chronic kidney disease: Secondary | ICD-10-CM | POA: Diagnosis not present

## 2020-07-09 DIAGNOSIS — I129 Hypertensive chronic kidney disease with stage 1 through stage 4 chronic kidney disease, or unspecified chronic kidney disease: Secondary | ICD-10-CM | POA: Diagnosis not present

## 2020-07-09 DIAGNOSIS — Z20822 Contact with and (suspected) exposure to covid-19: Secondary | ICD-10-CM | POA: Insufficient documentation

## 2020-07-09 DIAGNOSIS — Z862 Personal history of diseases of the blood and blood-forming organs and certain disorders involving the immune mechanism: Secondary | ICD-10-CM

## 2020-07-09 HISTORY — DX: Cardiac arrhythmia, unspecified: I49.9

## 2020-07-09 LAB — COMPREHENSIVE METABOLIC PANEL
ALT: 14 U/L (ref 0–44)
AST: 27 U/L (ref 15–41)
Albumin: 4.1 g/dL (ref 3.5–5.0)
Alkaline Phosphatase: 48 U/L (ref 38–126)
Anion gap: 10 (ref 5–15)
BUN: 27 mg/dL — ABNORMAL HIGH (ref 8–23)
CO2: 23 mmol/L (ref 22–32)
Calcium: 9.1 mg/dL (ref 8.9–10.3)
Chloride: 109 mmol/L (ref 98–111)
Creatinine, Ser: 1.67 mg/dL — ABNORMAL HIGH (ref 0.61–1.24)
GFR, Estimated: 39 mL/min — ABNORMAL LOW (ref >60)
Glucose, Bld: 114 mg/dL — ABNORMAL HIGH (ref 70–99)
Potassium: 4.1 mmol/L (ref 3.5–5.1)
Sodium: 142 mmol/L (ref 135–145)
Total Bilirubin: 1.2 mg/dL (ref 0.3–1.2)
Total Protein: 7.1 g/dL (ref 6.5–8.1)

## 2020-07-09 LAB — TROPONIN I (HIGH SENSITIVITY): Troponin I (High Sensitivity): 12 ng/L (ref <18)

## 2020-07-09 LAB — RESP PANEL BY RT-PCR (FLU A&B, COVID) ARPGX2
Influenza A by PCR: NEGATIVE
Influenza B by PCR: NEGATIVE
SARS Coronavirus 2 by RT PCR: NEGATIVE

## 2020-07-09 LAB — CBC
HCT: 36.6 % — ABNORMAL LOW (ref 39.0–52.0)
Hemoglobin: 12.1 g/dL — ABNORMAL LOW (ref 13.0–17.0)
MCH: 33.1 pg (ref 26.0–34.0)
MCHC: 33.1 g/dL (ref 30.0–36.0)
MCV: 100 fL (ref 80.0–100.0)
Platelets: 146 10*3/uL — ABNORMAL LOW (ref 150–400)
RBC: 3.66 MIL/uL — ABNORMAL LOW (ref 4.22–5.81)
RDW: 12 % (ref 11.5–15.5)
WBC: 3.6 10*3/uL — ABNORMAL LOW (ref 4.0–10.5)
nRBC: 0 % (ref 0.0–0.2)

## 2020-07-09 LAB — IRON AND TIBC
Iron: 122 ug/dL (ref 45–182)
Saturation Ratios: 38 % (ref 17.9–39.5)
TIBC: 323 ug/dL (ref 250–450)
UIBC: 201 ug/dL

## 2020-07-09 LAB — FERRITIN: Ferritin: 109 ng/mL (ref 24–336)

## 2020-07-09 MED ORDER — ACETAMINOPHEN 650 MG RE SUPP: 650.0000 mg | Freq: Four times a day (QID) | RECTAL | Status: DC | PRN

## 2020-07-09 MED ORDER — ACETAMINOPHEN 325 MG PO TABS: 650.0000 mg | ORAL_TABLET | Freq: Four times a day (QID) | ORAL | Status: DC | PRN

## 2020-07-09 MED ORDER — TIZANIDINE HCL 2 MG PO TABS
2.0000 mg | ORAL_TABLET | Freq: Every day | ORAL | Status: DC
  Administered 2020-07-10: 2 mg via ORAL
  Filled 2020-07-09 (×2): qty 1

## 2020-07-09 MED ORDER — PANTOPRAZOLE SODIUM 40 MG IV SOLR
40.0000 mg | Freq: Two times a day (BID) | INTRAVENOUS | Status: DC
  Administered 2020-07-09 – 2020-07-10 (×2): 40 mg via INTRAVENOUS
  Filled 2020-07-09 (×2): qty 40

## 2020-07-09 MED ORDER — FINASTERIDE 5 MG PO TABS
5.0000 mg | ORAL_TABLET | Freq: Every day | ORAL | Status: DC
  Administered 2020-07-10: 5 mg via ORAL
  Filled 2020-07-09: qty 1

## 2020-07-09 NOTE — H&P (Addendum)
History and Physical    PLEASE NOTE THAT DRAGON DICTATION SOFTWARE WAS USED IN THE CONSTRUCTION OF THIS NOTE.   Nathaniel Villanueva [id number] DOB: [birth year] DOA: 07/09/2020  PCP: [place] Patient coming from: home   I have personally briefly reviewed patient's old medical records in [place]  Chief Complaint: Dark-colored stool  HPI: Nathaniel Villanueva is a 87 y.o. male with medical history significant for persistent atrial fibrillation chronically anticoagulated on Eliquis, stage IIIb chronic kidney disease with baseline creatinine 1.4-1.65, anemia of chronic kidney disease with baseline hemoglobin 11.5-12, type 2 diabetes mellitus managed via lifestyle modifications in the setting of prior complicating hypoglycemia, hypertension, BPH, GERD, who is admitted to [facility] on 07/09/2020 with suspected acute upper GI bleed after presenting from home to [facility] Emergency Department complaining of dark-colored stool.   Plan: The patient reports to 3 days of well-formed dark-colored stool in the absence of any overt hematochezia.  Denies any associated abdominal pain, nausea, vomiting, or hematemesis.  States that the dark coloration to his stool represents a new finding for him over that timeframe.  He confirms that he is on Eliquis by means of chronic anticoagulation in the setting of a history of persistent atrial fibrillation.  Otherwise, he is on no blood thinning agents, including no aspirin.  Denies any recent trauma.  Denies any associated chest pain, palpitations, diaphoresis, shortness of breath, dizziness, presyncope, or syncope.  He also denies any associated subjective fever, chills, rigors, or generalized myalgias.  No recent headache, neck stiffness, rhinitis, rhinorrhea, sore throat, wheezing, cough, or rash.  No recent traveling or known COVID-19 exposures.  He also denies any recent dysuria, gross hematuria, or change in urinary urgency/frequency.  Per  chart review, it appears that the patient's most recent EGD/colonoscopy occurred in August 2016, at which time EGD revealed erosive esophagitis as well as gastritis, while colonoscopy at that time showed evidence of internal hemorrhoids as well as tubular adenoma of the colon.   The patient denies any chronic consumption of alcohol, denies any known underlying liver disease.  He also denies any routine use of NSAIDs.  Denies any use of blood thinners as an outpatient, including no aspirin. Denies use of NSAIDs.Denies any recent use of systemic corticosteroids, bisphosphonate medications, supplemental potassium, supplemental iron.   Denies any personal history of malignancy, and denies any recent unintentional weight loss, dysphagia, or early satiety.  In the context of persistent atrial fibrillation, the patient follows with [facility] cardiology, and appears to have been most recently seen by this group in September 2021.  Review of the cardiology documentation from that time reveals that the patient's persistent atrial fibrillation has been complicated intermittent bradycardia over the course of the last few years.  He reportedly underwent Holter monitoring in May 2020 to further evaluate his bradycardia, and cardiology documentation describes longest sinus pause of 2.6 seconds, but does not go into additional detail regarding the patient's average heart rate or lowest heart rate.  Per review of this most recent outpatient cardiac documentation, the patient's beta-blocker was stopped 4 to 5 months ago, and given that the patient has been asymptomatic with his bradycardia while maintaining normotensive blood pressures, the reported plan has been to continue to monitor.  Not currently on any AV nodal blocking agents.     ED Course:  Vital signs in the ED were notable for the following: Tetramex 98.5, heart rate 37-99; blood pressure 131/65 - 151/87; respiratory rate 16-17, oxygen saturation  97 to 100% on  room air.  CMP was notable for the following: Sodium 142, potassium 4.1, bicarbonate 23, BUN 27, which is relative to most recent prior BUN 32 in October 2020, creatinine 1.67, glucose 114, calcium 9.1, and albumin 4.1.  High-sensitivity troponin I found to be 12.  CBC was notable for the following: Hemoglobin 12.1 relative to most recent prior value of 11.7 on 09/24/2018.  This evening's hemoglobin is associated with normocytic and normochromic findings, as well as a nonelevated RDW.   EKG shows atrial flutter with patient with ventricular rate 43, right bundle branch block, left anterior, no evidence of T wave or ST changes, including no evidence of ST elevation.   While in the ED, the following were administered: (None).  Subsequently, the patient was admitted for overnight observation for further evaluation and management of suspected presenting acute upper gastrointestinal bleed as well as overnight observation in the setting of asymptomatic bradycardia.     Review of Systems: As per HPI otherwise 10 point review of systems negative.   Past Medical History:  Diagnosis Date  . Anemia   . Diabetes mellitus without complication ([place])   . Hyperlipidemia   . Hypertension   . Hypoglycemia     Past Surgical History:  Procedure Laterality Date  . COLONOSCOPY WITH PROPOFOL N/A 03/04/2015   Procedure: COLONOSCOPY WITH PROPOFOL;  Surgeon: Lollie Sails, MD;  Location: [facility] ENDOSCOPY;  Service: Endoscopy;  Laterality: N/A;  . ESOPHAGOGASTRODUODENOSCOPY (EGD) WITH PROPOFOL N/A 03/04/2015   Procedure: ESOPHAGOGASTRODUODENOSCOPY (EGD) WITH PROPOFOL;  Surgeon: Lollie Sails, MD;  Location: [facility] ENDOSCOPY;  Service: Endoscopy;  Laterality: N/A;  . SKIN CANCER EXCISION      Social History:  reports that he has quit smoking. He has never used smokeless tobacco. He reports that he does not drink alcohol and does not use drugs.   No Known Allergies  Family History  Problem Relation Age of  Onset  . Hypertension Mother   . Prostate cancer Neg Hx   . Bladder Cancer Neg Hx   . Kidney cancer Neg Hx      Prior to Admission medications   Medication Sig Start Date End Date Taking? Authorizing Provider  amLODipine (NORVASC) 10 MG tablet Take 10 mg by mouth daily.    [provider]  doxazosin (CARDURA) 4 MG tablet Take 4 mg by mouth daily.    [provider]  finasteride (PROSCAR) 5 MG tablet Take 5 mg by mouth daily.    [provider]  lisinopril (PRINIVIL,ZESTRIL) 20 MG tablet Take 20 mg by mouth daily.    [provider]  pantoprazole (PROTONIX) 40 MG tablet Take 40 mg by mouth daily.    [provider]     Objective    Physical Exam: Vitals:   07/09/20 1308 07/09/20 1502 07/09/20 1800  BP: 132/73 131/65 (!) 151/87  Pulse: 62 99 (!) 57  Resp: 16 17 13   Temp: 98.5 F (36.9 C)    TempSrc: Oral    SpO2: 97% 100% 99%    General: appears to be stated age; alert, oriented Skin: warm, dry, no rash Head:  AT/[place] Mouth:  Oral mucosa membranes appear moist, normal dentition Neck: supple; trachea midline Heart: Irregular bradycardic at times; did not appreciate any M/R/G Lungs: CTAB, did not appreciate any wheezes, rales, or rhonchi Abdomen: + BS; soft, ND, NT Vascular: 2+ pedal pulses b/l; 2+ radial pulses b/l Extremities: no peripheral edema, no muscle wasting Neuro: strength and  sensation intact in upper and lower extremities b/l    Labs on Admission: I have personally reviewed following labs and imaging studies  CBC: Recent Labs  Lab 07/09/20 1316  WBC 3.6*  HGB 12.1*  HCT 36.6*  MCV 100.0  PLT 146*   Basic Metabolic Panel: Recent Labs  Lab 07/09/20 1316  NA 142  K 4.1  CL 109  CO2 23  GLUCOSE 114*  BUN 27*  CREATININE 1.67*  CALCIUM 9.1   GFR: CrCl cannot be calculated (Unknown ideal weight.). Liver Function Tests: Recent Labs  Lab 07/09/20 1316  AST 27  ALT 14  ALKPHOS 48  BILITOT 1.2   PROT 7.1  ALBUMIN 4.1   No results for input(s): LIPASE, AMYLASE in the last 168 hours. No results for input(s): AMMONIA in the last 168 hours. Coagulation Profile: No results for input(s): INR, PROTIME in the last 168 hours. Cardiac Enzymes: No results for input(s): CKTOTAL, CKMB, CKMBINDEX, TROPONINI in the last 168 hours. BNP (last 3 results) No results for input(s): PROBNP in the last 8760 hours. HbA1C: No results for input(s): HGBA1C in the last 72 hours. CBG: No results for input(s): GLUCAP in the last 168 hours. Lipid Profile: No results for input(s): CHOL, HDL, LDLCALC, TRIG, CHOLHDL, LDLDIRECT in the last 72 hours. Thyroid Function Tests: No results for input(s): TSH, T4TOTAL, FREET4, T3FREE, THYROIDAB in the last 72 hours. Anemia Panel: No results for input(s): VITAMINB12, FOLATE, FERRITIN, TIBC, IRON, RETICCTPCT in the last 72 hours. Urine analysis:    Component Value Date/Time   COLORURINE YELLOW 07/31/2019 1215   APPEARANCEUR CLEAR 07/31/2019 1215   APPEARANCEUR Clear 10/01/2017 1013   LABSPEC 1.015 07/31/2019 1215   LABSPEC 1.004 01/25/2014 1651   PHURINE 5.0 07/31/2019 1215   GLUCOSEU NEGATIVE 07/31/2019 1215   GLUCOSEU Negative 01/25/2014 1651   HGBUR NEGATIVE 07/31/2019 1215   BILIRUBINUR NEGATIVE 07/31/2019 1215   BILIRUBINUR Negative 10/01/2017 1013   BILIRUBINUR Negative 01/25/2014 1651   KETONESUR NEGATIVE 07/31/2019 1215   PROTEINUR NEGATIVE 07/31/2019 1215   NITRITE NEGATIVE 07/31/2019 1215   LEUKOCYTESUR NEGATIVE 07/31/2019 1215   LEUKOCYTESUR Negative 01/25/2014 1651    Radiological Exams on Admission: No results found.   EKG: Independently reviewed, with result as described above.    Assessment/Plan   THAILAND DUBE is a 87 y.o. male with medical history significant for persistent atrial fibrillation chronically anticoagulated on Eliquis, stage IIIb chronic kidney disease with baseline creatinine 1.4-1.65, anemia of chronic kidney disease  with baseline hemoglobin 11.5-12, type 2 diabetes mellitus managed via lifestyle modifications in the setting of prior complicating hypoglycemia, hypertension, BPH, GERD, who is admitted to [facility] on 07/09/2020 with suspected acute upper GI bleed after presenting from home to [facility] Emergency Department complaining of dark-colored stool.   Principal Problem:   Acute upper GI bleeding Active Problems:   HTN (hypertension)   Type 2 diabetes mellitus with stage 3 chronic kidney disease, without long-term current use of insulin (HCC)   Bradycardia   CKD (chronic kidney disease) stage 3, GFR 30-59 ml/min (HCC)   History of anemia due to chronic kidney disease   GERD (gastroesophageal reflux disease)   Persistent atrial fibrillation (HCC)    #) Acute upper gastrointestinal bleed: Suspected diagnosis on the basis of presenting 2 to 3 days of dark-colored stool, in the context of being chronically anticoagulated on Eliquis, but otherwise no additional blood thinning agents.  The patient is otherwise completely asymptomatic, hemodynamically stable appearing vital signs.  In the absence of any additional symptoms and the presence of hemodynamic stability in spite of to 3 days of noting dark-colored stool, suspect an upper gastrointestinal bleed would be very slow in nature.  Interestingly, presenting BUN to be 27, which is trending down from most recent prior value of 32 in October 2020.  No NSAID use or history of liver disease or reported history of alcohol abuse.  It appears that his most recent EGD occurred in August 2016 revealing esophagitis as well as gastritis, will most recent colonoscopy also occurred at that time and showed some internal hemorrhoids as well as a tubular adenoma of the colon.  In the absence of any known liver disease, initiation of SBP prophylaxis not appear to be warranted at this time.  Presentation and history was suggestive of variceal bleed, and therefore  there is not appear to be an indication for initiation of octreotide.  Of note, presenting hemoglobin appears to be within baseline range in the context of a history of anemia of chronic kidney disease, with baseline hemoglobin range of 11.5-12, with presenting hemoglobin found to be 12.1 this evening.  Start Protonix IV twice daily, and consult gastroenterology.       Plan: NPO after midnight. Refraining from pharmacologic DVT prophylaxis for now, including holding dose of Eliquis.  SCDs. Monitor on telemetry. Monitor continuous pulse-ox. Maintain at least 2 large bore IV's. Check INR in the AM.  Repeat CBC in the morning.  Add on iron studies.  Consult gastroenterology, as above.  Recheck BMP in the morning, with attention to interval trend BUN level.  I placed an order with the blood bank to type, screen, and hold 2 units PRBC.      #) Bradycardia: Per review of cardiology documentation, it appears that the patient has a history bradycardia in the context of a history of permanent atrial fibrillation.  In May 2020 he reportedly underwent Holter monitoring, which detected longest pause of 2.6 seconds.  Most recent outpatient cardiology appointment [facility] occurred in September 2021, and noted that the patient has been consistently asymptomatic with his bradycardia, which has persisted in spite of discontinuation of his metoprolol approximately 5 months ago.  Per review of his cardiology documentation, it appears that the ongoing plan from a cardiac standpoint is to continue to monitor is HR's given that he remains asymptomatic hemodynamically stable.  Consistent with this history, the patient remains asymptomatic and normotensive with his bradycardia observed in the ED tonight, with associated heart rates into the high 30s at times.  As the patient's bradycardia is being actively followed by outpatient cardiology, without evidence of interval change in the severity thereof while maintaining  hemodynamic stability and remaining completely asymptomatic, will plan to observe on telemetry overnight and request that the patient continue to follow-up with his outpatient cardiologist clinical or telemetry evaluation should warrant a more expedited evaluation.  Plan: Monitor on telemetry overnight.  Repeat BMP in the morning for evaluation of interval hyperkalemia.  Check ionized calcium.  Check TSH.  Close monitoring of ensuing blood pressures.       #) Persistent atrial fibrillation: in setting of CHA2DS2-VASc score of 4, patient is chronically anticoagulated on with Eliquis, and complicated by long history of bradycardia for which the patient has been undergoing monitoring for development of sick sinus syndrome.  Is in atrial fibrillation at this evening's presentation, with normotensive blood pressures and intermittent, as further quantified above.  In this setting, the patient has been completely off  of all AV nodal blocking agents 6 months.  Plan: Holding this evening's dose Eliquis in the setting of suspected acute upper gastrointestinal bleed.  Monitor on telemetry.  Add on serum magnesium level.  Continuation of outpatient evaluation of bradycardia by [facility] cardiology, as further described above.       #) Hypertension: Outpatient hypertensive regimen consists of the following per most recent outpatient medication list: Lisinopril, losartan, Norvasc, doxazosin.  We will consult inpatient pharmacist for verification of the inclusion of both an ACE inhibitor as well as an ARB.  Systolic blood pressures running in the 130s 150s since presenting to the ED this evening.  In the context of suspected presenting acute upper gastrointestinal bleed, will closely monitor ensuing blood pressure overnight, wall holding additional doses of his home antihypertensive regimen for now.  Plan: Hypertensive medications overnight, with close monitoring of ensuing blood pressure via routine vital signs  over that time.  Inpatient pharmacy consult for clarification of reported inclusion of both an ACE inhibitor as well as ARB on his current outpatient medication list.      #) Stage IIIb chronic kidney disease: Associated with baseline creatinine range 1.4 -1.65.  Presenting labs this evening reflect serum creatinine within this range.  Patient's chronic kidney disease appears complicated by anemia of ckd, as further discussed below.  Plan: Monitor strict I's and O's and daily weights.  Attempt avoid nephrotoxic agents.  Clarification of reported inclusion of both lisinopril and losartan on his current outpatient medication list, as further detailed above.  Repeat BMP in the morning.     #) Anemia of chronic kidney disease: Associated with baseline hemoglobin range of 11.5-12, with most recent prior hemoglobin data point noted to be 11.7 in March 2020.  This evening's labs reflect hemoglobin of 12.1, consistent with this establish baseline range.  Will closely monitor ensuing trend hemoglobin given suspected presenting acute upper gastrointestinal bleed, as above.  Plan on Repeat CBC in the morning.  Check INR.  Add on iron studies given concern for acute upper gastrointestinal bleed.  Monitor on telemetry.      #) gerd: On oral Protonix as an outpatient.  Plan: In the setting of concern for presenting acute upper gastrointestinal bleed, will convert to Protonix 4 mg IV twice daily for now, pending recommendations from ensuing gastroenterology consult, as above.     #) BPH: On finasteride as an outpatient.  Plan: Continue finasteride.  Monitor strict I's and O's and daily weights.  Repeat BMP tomorrow.    DVT prophylaxis: SCDs SCDs Code Status: Full code Family Communication: none Disposition Plan: Per Rounding Team Admission status: Observation; pcu    Of note, this patient was added by me to the following Admit List/Treatment Team:  [facility]      PLEASE NOTE THAT  DRAGON DICTATION SOFTWARE WAS USED IN THE CONSTRUCTION OF THIS NOTE.   [place] [facility] Pager [phone] From 12PM- 12AM  Otherwise, please contact night-coverage  [email or web address] Password [email or web address]  07/09/2020, 7:05 PM

## 2020-07-09 NOTE — ED Notes (Signed)
EDP at bedside for hemocult test.

## 2020-07-09 NOTE — ED Provider Notes (Addendum)
[facility] Emergency Department Provider Note   ____________________________________________   Event Date/Time   First MD Initiated Contact with Patient 07/09/20 1744     (approximate)  I have reviewed the triage vital signs and the nursing notes.   HISTORY  Chief Complaint Melena and Abdominal Pain   HPI Nathaniel Villanueva is a 87 y.o. male who comes in reporting black tarry stools for several days.  It seems to lighten up a little bit today he says.  He reports he is not lightheaded or having any chest or abdominal discomfort.  When I push on his lower abdomen he reports he feels like he needs to urinate.  Patient has no other complaints.        Past Medical History:  Diagnosis Date  . Anemia   . Diabetes mellitus without complication (HCC)   . Hyperlipidemia   . Hypertension   . Hypoglycemia     Patient Active Problem List   Diagnosis Date Noted  . CKD (chronic kidney disease) stage 3, GFR 30-59 ml/min (HCC) 01/22/2019  . Constipation 01/22/2019  . Diabetic peripheral neuropathy (HCC) 01/22/2019  . Microalbuminuric diabetic nephropathy (HCC) 01/22/2019  . Bradycardia 09/23/2018  . Type 2 diabetes mellitus with stage 3 chronic kidney disease, without long-term current use of insulin (HCC) 09/27/2015  . Anxiety, mild 08/30/2015  . Syncope 04/05/2015  . Vitamin D deficiency 05/10/2014  . Hypoglycemia associated with diabetes (HCC) 02/01/2014  . Microalbuminuria 02/01/2014  . Anemia 01/02/2014  . Neck pain 01/02/2014  . HTN (hypertension) 01/02/2014  . Hypercholesterolemia 01/02/2014    Past Surgical History:  Procedure Laterality Date  . COLONOSCOPY WITH PROPOFOL N/A 03/04/2015   Procedure: COLONOSCOPY WITH PROPOFOL;  Surgeon: Christena Deem, MD;  Location: [facility] ENDOSCOPY;  Service: Endoscopy;  Laterality: N/A;  . ESOPHAGOGASTRODUODENOSCOPY (EGD) WITH PROPOFOL N/A 03/04/2015   Procedure: ESOPHAGOGASTRODUODENOSCOPY (EGD) WITH PROPOFOL;   Surgeon: Christena Deem, MD;  Location: [facility] ENDOSCOPY;  Service: Endoscopy;  Laterality: N/A;  . SKIN CANCER EXCISION      Prior to Admission medications   Medication Sig Start Date End Date Taking? Authorizing Provider  amLODipine (NORVASC) 10 MG tablet Take 10 mg by mouth daily.    [provider]  doxazosin (CARDURA) 4 MG tablet Take 4 mg by mouth daily.    [provider]  finasteride (PROSCAR) 5 MG tablet Take 5 mg by mouth daily.    [provider]  lisinopril (PRINIVIL,ZESTRIL) 20 MG tablet Take 20 mg by mouth daily.    [provider]  pantoprazole (PROTONIX) 40 MG tablet Take 40 mg by mouth daily.    [provider]    Allergies Patient has no known allergies.  Family History  Problem Relation Age of Onset  . Hypertension Mother   . Prostate cancer Neg Hx   . Bladder Cancer Neg Hx   . Kidney cancer Neg Hx     Social History Social History   Tobacco Use  . Smoking status: Former Games developer  . Smokeless tobacco: Never Used  Substance Use Topics  . Alcohol use: No  . Drug use: No    Review of Systems  Constitutional: No fever/chills Eyes: No visual changes. ENT: No sore throat. Cardiovascular: Denies chest pain. Respiratory: Denies shortness of breath. Gastrointestinal: No abdominal pain.  No nausea, no vomiting.  No diarrhea.  No constipation. Genitourinary: Negative for dysuria. Musculoskeletal: Negative for back pain. Skin: Negative for rash. Neurological: Negative for headaches, focal weakness  ____________________________________________   PHYSICAL EXAM:  VITAL SIGNS: ED Triage Vitals [07/09/20 1308]  Enc Vitals Group     BP 132/73     Pulse Rate 62     Resp 16     Temp 98.5 F (36.9 C)     Temp Source Oral     SpO2 97 %     Weight      Height      Head Circumference      Peak Flow      Pain Score      Pain Loc      Pain Edu?      Excl. in [place]?     Constitutional: Alert and oriented. Well  appearing and in no acute distress. Eyes: Conjunctivae are normal. Head: Atraumatic. Nose: No congestion/rhinnorhea. Mouth/Throat: Mucous membranes are moist.  Oropharynx non-erythematous. Neck: No stridor.   Cardiovascular: Slow rate, regular rhythm. Grossly normal heart sounds.  Good peripheral circulation. Respiratory: Normal respiratory effort.  No retractions. Lungs CTAB. Gastrointestinal: Soft and nontender. No distention. No abdominal bruits. No CVA tenderness. Rectal: Small external hemorrhoid about 1 cm in diameter that is soft on palpation is present.  There is no active bleeding.  Stool is dark but Hemoccult negative.  No masses are palpated. Musculoskeletal: No lower extremity tenderness nor edema.  Neurologic:  Normal speech and language. No gross focal neurologic deficits are appreciated.  Skin:  Skin is warm, dry and intact. No rash noted.   ____________________________________________   LABS (all labs ordered are listed, but only abnormal results are displayed)  Labs Reviewed  COMPREHENSIVE METABOLIC PANEL - Abnormal; Notable for the following components:      Result Value   Glucose, Bld 114 (*)    BUN 27 (*)    Creatinine, Ser 1.67 (*)    GFR, Estimated 39 (*)    All other components within normal limits  CBC - Abnormal; Notable for the following components:   WBC 3.6 (*)    RBC 3.66 (*)    Hemoglobin 12.1 (*)    HCT 36.6 (*)    Platelets 146 (*)    All other components within normal limits  POC OCCULT BLOOD, ED   ____________________________________________  EKG EKG read interpreted by me shows A. fib at a rate of 43 left axis right bundle branch left anterior hemiblock according to computer no acute ST-T wave changes are seen baseline is irregular however there may be ST elevation in V4 but I doubt it.  ____________________________________________  RADIOLOGY Gertha Calkin, personally viewed and evaluated these images (plain radiographs) as part of  my medical decision making, as well as reviewing the written report by the radiologist.    Official radiology report(s): No results found.  ____________________________________________   PROCEDURES  Procedure(s) performed (including Critical Care):  Procedures   ____________________________________________   INITIAL IMPRESSION / ASSESSMENT AND PLAN / ED COURSE  Patient with episodes of bradycardia dropping down to 31.  Even though the patient is not lightheaded when he sitting there this makes me very worried.  I think it would be best to admit him overnight and possibly decrease his Norvasc slightly.  I will talk to the hospital doctor about him.              ____________________________________________   FINAL CLINICAL IMPRESSION(S) / ED DIAGNOSES  Final diagnoses:  Black stools  Bradycardia     ED Discharge Orders    None      *Please  note:  JAYMIR SLASKI was evaluated in Emergency Department on 07/09/2020 for the symptoms described in the history of present illness. He was evaluated in the context of the global COVID-19 pandemic, which necessitated consideration that the patient might be at risk for infection with the SARS-CoV-2 virus that causes COVID-19. Institutional protocols and algorithms that pertain to the evaluation of patients at risk for COVID-19 are in a state of rapid change based on information released by regulatory bodies including the CDC and federal and state organizations. These policies and algorithms were followed during the patient's care in the ED.  Some ED evaluations and interventions may be delayed as a result of limited staffing during and the pandemic.*   Note:  This document was prepared using Dragon voice recognition software and may include unintentional dictation errors.    Nena Polio, MD 07/09/20 1849    Nena Polio, MD 07/09/20 Darlin Drop

## 2020-07-09 NOTE — Discharge Instructions (Addendum)
Please follow-up with your regular family doctor and/or your cardiologist to check on your heart rate.  Please return here if you have more black tarry stools or get weak or lightheaded.  Please let your family doctor know about your black stool today.  There was no blood in the stool and I checked that though.  I would see your family doctor before the end of the week to check on the stool.

## 2020-07-09 NOTE — ED Notes (Signed)
RN Barbee Cough called and informed of pt being roomed at this time.

## 2020-07-09 NOTE — Progress Notes (Signed)
Brief note regarding preliminary plan, with full H&P to follow:  87 year old male this evening to [facility] [place] for further evaluation of bradycardia after presenting to [facility] for evaluation of dark-colored stool.  Intermittently bradycardic into the high 30s at which time the patient has remained asymptomatic and normotensive.  Presenting hemoglobin found to be within baseline range, initial Hemoccult stool finding noted to be negative.  Aside from the presenting dark stool, patient has remained completely asymptomatic.     Newton Pigg, DO Hospitalist

## 2020-07-09 NOTE — ED Notes (Signed)
HR on monitor is dropping to 40, 30. EDP at bedside. This nurse assessed pt, who is A&O times four and denies dizziness. HR currently 40-50.

## 2020-07-09 NOTE — ED Triage Notes (Signed)
Pt sent by [facility] for black, tarry stools. Pt reports stools have been darkening x 1 wk now. Denies any blood thinner use or hx of bloody stools. Feels his normal self, no weakness or sob. Is reporting some mild RLQ pain. No n/v/d

## 2020-07-09 NOTE — Progress Notes (Signed)
Update: I have consulted the on-call gastroenterologist, Dr.Tahiliani, for assistance with recommendations regarding additional evaluation/management of suspected acute upper GI bleed.    Newton Pigg, DO Hospitalist

## 2020-07-09 NOTE — ED Notes (Signed)
Pt states black tarry stools for past 2-3d. Denies pain. Guarding noted in lower abdominal area with palpation, noted by PA student.

## 2020-07-10 ENCOUNTER — Encounter
Admission: EM | Disposition: A | Discharge status: Home / Self Care | Payer: Self-pay | Source: Home / Self Care | Attending: Emergency Medicine

## 2020-07-10 ENCOUNTER — Encounter: Payer: Self-pay | Admitting: Internal Medicine

## 2020-07-10 ENCOUNTER — Observation Stay: Payer: Medicare Other | Admitting: Anesthesiology

## 2020-07-10 DIAGNOSIS — K922 Gastrointestinal hemorrhage, unspecified: Secondary | ICD-10-CM | POA: Diagnosis not present

## 2020-07-10 HISTORY — PX: ESOPHAGOGASTRODUODENOSCOPY: SHX5428

## 2020-07-10 LAB — PROTIME-INR
INR: 1.3 — ABNORMAL HIGH (ref 0.8–1.2)
Prothrombin Time: 15.7 seconds — ABNORMAL HIGH (ref 11.4–15.2)

## 2020-07-10 LAB — CBC
HCT: 33.5 % — ABNORMAL LOW (ref 39.0–52.0)
Hemoglobin: 11.3 g/dL — ABNORMAL LOW (ref 13.0–17.0)
MCH: 33.4 pg (ref 26.0–34.0)
MCHC: 33.7 g/dL (ref 30.0–36.0)
MCV: 99.1 fL (ref 80.0–100.0)
Platelets: 144 10*3/uL — ABNORMAL LOW (ref 150–400)
RBC: 3.38 MIL/uL — ABNORMAL LOW (ref 4.22–5.81)
RDW: 11.9 % (ref 11.5–15.5)
WBC: 3.7 10*3/uL — ABNORMAL LOW (ref 4.0–10.5)
nRBC: 0 % (ref 0.0–0.2)

## 2020-07-10 LAB — TYPE AND SCREEN
ABO/RH(D): O POS
Antibody Screen: NEGATIVE

## 2020-07-10 LAB — COMPREHENSIVE METABOLIC PANEL
ALT: 12 U/L (ref 0–44)
AST: 23 U/L (ref 15–41)
Albumin: 3.6 g/dL (ref 3.5–5.0)
Alkaline Phosphatase: 42 U/L (ref 38–126)
Anion gap: 8 (ref 5–15)
BUN: 26 mg/dL — ABNORMAL HIGH (ref 8–23)
CO2: 25 mmol/L (ref 22–32)
Calcium: 8.7 mg/dL — ABNORMAL LOW (ref 8.9–10.3)
Chloride: 108 mmol/L (ref 98–111)
Creatinine, Ser: 1.73 mg/dL — ABNORMAL HIGH (ref 0.61–1.24)
GFR, Estimated: 38 mL/min — ABNORMAL LOW (ref >60)
Glucose, Bld: 116 mg/dL — ABNORMAL HIGH (ref 70–99)
Potassium: 4.3 mmol/L (ref 3.5–5.1)
Sodium: 141 mmol/L (ref 135–145)
Total Bilirubin: 1 mg/dL (ref 0.3–1.2)
Total Protein: 6.3 g/dL — ABNORMAL LOW (ref 6.5–8.1)

## 2020-07-10 LAB — MAGNESIUM: Magnesium: 2.1 mg/dL (ref 1.7–2.4)

## 2020-07-10 LAB — TSH: TSH: 1.997 u[IU]/mL (ref 0.350–4.500)

## 2020-07-10 LAB — CBG MONITORING, ED: Glucose-Capillary: 89 mg/dL (ref 70–99)

## 2020-07-10 LAB — TROPONIN I (HIGH SENSITIVITY): Troponin I (High Sensitivity): 12 ng/L (ref <18)

## 2020-07-10 SURGERY — EGD (ESOPHAGOGASTRODUODENOSCOPY)
Anesthesia: General

## 2020-07-10 MED ORDER — GLYCOPYRROLATE 0.2 MG/ML IJ SOLN
INTRAMUSCULAR | Status: DC | PRN
  Administered 2020-07-10: .2 mg via INTRAVENOUS

## 2020-07-10 MED ORDER — PROPOFOL 500 MG/50ML IV EMUL
INTRAVENOUS | Status: DC | PRN
  Administered 2020-07-10: 100 ug/kg/min via INTRAVENOUS

## 2020-07-10 MED ORDER — PROPOFOL 500 MG/50ML IV EMUL
INTRAVENOUS | Status: AC
  Filled 2020-07-10: qty 50

## 2020-07-10 MED ORDER — SODIUM CHLORIDE 0.9 % IV SOLN
INTRAVENOUS | Status: DC
  Administered 2020-07-10: 1000 mL via INTRAVENOUS

## 2020-07-10 MED ORDER — PROPOFOL 10 MG/ML IV BOLUS
INTRAVENOUS | Status: DC | PRN
  Administered 2020-07-10: 50 mg via INTRAVENOUS

## 2020-07-10 NOTE — ED Notes (Signed)
Pt resting comfortably at this time. NAD noted at this time. Pt is bradycardiac with a ECG HR of high 30'S. Pt denies dizziness sor any symptoms associated with low HR. NAD noted. Urinal emptied, charted in I&O section. Call bell in reach. Pt denies any further needs at this time.

## 2020-07-10 NOTE — ED Notes (Signed)
This RN spoke to lab regarding for the need to still have a T&S obtained that was ordered last night. Lab states they will send lab to draw.

## 2020-07-10 NOTE — Op Note (Signed)
[facility] Gastroenterology Patient Name: Nathaniel Villanueva Procedure Date: 07/10/2020 1:22 PM MRN: [id number] Account #: [id number] Date of Birth: 03-01-1931 Admit Type: Outpatient Age: 87 Room: [facility] ENDO ROOM 2 Gender: Male Note Status: Finalized Procedure:             Upper GI endoscopy Indications:           Melena Providers:             Benay Pike. Alice Reichert MD, MD Referring MD:          No Local Md, MD (Referring MD) Medicines:             Propofol per Anesthesia Complications:         No immediate complications. Procedure:             Pre-Anesthesia Assessment:                        - The risks and benefits of the procedure and the                         sedation options and risks were discussed with the                         patient. All questions were answered and informed                         consent was obtained.                        - Patient identification and proposed procedure were                         verified prior to the procedure by the nurse. The                         procedure was verified in the procedure room.                        - ASA Grade Assessment: III - A patient with severe                         systemic disease.                        - After reviewing the risks and benefits, the patient                         was deemed in satisfactory condition to undergo the                         procedure.                        After obtaining informed consent, the endoscope was                         passed under direct vision. Throughout the procedure,                         the patient's blood pressure, pulse, and  oxygen                         saturations were monitored continuously. The Endoscope                         was introduced through the mouth, and advanced to the                         third part of duodenum. The upper GI endoscopy was                         accomplished without difficulty. The patient tolerated                          the procedure well. Findings:      The esophagus was normal.      Patchy minimal inflammation characterized by erosions was found in the       gastric antrum.      The cardia and gastric fundus were normal on retroflexion.      The examined duodenum was normal.      The exam was otherwise without abnormality. Impression:            - Normal esophagus.                        - Gastritis.                        - Normal examined duodenum.                        - The examination was otherwise normal.                        - No specimens collected. Recommendation:        - Return patient to hospital ward for ongoing care.                        - Resume previous diet.                        - Continue present medications.                        - Resume Eliquis (apixaban) today at prior dose. Refer                         to managing physician for further adjustment of                         therapy.                        - KCGI sign off. Call back if needed. Procedure Code(s):     --- Professional ---                        [phone], Esophagogastroduodenoscopy, flexible,                         transoral; diagnostic, including collection of  specimen(s) by brushing or washing, when performed                         (separate procedure) Diagnosis Code(s):     --- Professional ---                        K92.1, Melena (includes Hematochezia)                        K29.70, Gastritis, unspecified, without bleeding CPT copyright 2019 American Medical Association. All rights reserved. The codes documented in this report are preliminary and upon coder review may  be revised to meet current compliance requirements. Efrain Sella MD, MD 07/10/2020 2:30:44 PM This report has been signed electronically. Number of Addenda: 0 Note Initiated On: 07/10/2020 1:22 PM Estimated Blood Loss:  Estimated blood loss: none.      [facility]

## 2020-07-10 NOTE — Interval H&P Note (Signed)
History and Physical Interval Note:  07/10/2020 2:11 PM  Nathaniel Villanueva  has presented today for surgery, with the diagnosis of Melena.  The various methods of treatment have been discussed with the patient and family. After consideration of risks, benefits and other options for treatment, the patient has consented to  Procedure(s): ESOPHAGOGASTRODUODENOSCOPY (EGD) (N/A) as a surgical intervention.  The patient's history has been reviewed, patient examined, no change in status, stable for surgery.  I have reviewed the patient's chart and labs.  Questions were answered to the patient's satisfaction.     [place], [place]

## 2020-07-10 NOTE — OR Nursing (Signed)
Patient is being discharged home per Dr. Tama Gander instructions.  Patient was given AVS, and daughter, Ilda Basset was called and she is on her way to pick him up.  Discharge instructions were given to the patient and went over verbally with Ilda Basset.  Both acknowledged understanding.  Pt is in stable condition, with no apparent distress noted.  Patient is awake, alert and oriented x4, and able to tolerate food and liquid intake.

## 2020-07-10 NOTE — Transfer of Care (Signed)
Immediate Anesthesia Transfer of Care Note  Patient: Nathaniel Villanueva  Procedure(s) Performed: ESOPHAGOGASTRODUODENOSCOPY (EGD) (N/A )  Patient Location: PACU  Anesthesia Type:General  Level of Consciousness: sedated  Airway & Oxygen Therapy: Patient Spontanous Breathing and Patient connected to nasal cannula oxygen  Post-op Assessment: Report given to RN and Post -op Vital signs reviewed and stable  Post vital signs: Reviewed and stable  Last Vitals:  Vitals Value Taken Time  BP 141/86 07/10/20 1430  Temp 36.1 C 07/10/20 1430  Pulse 75 07/10/20 1431  Resp 12 07/10/20 1430  SpO2 100 % 07/10/20 1431  Vitals shown include unvalidated device data.  Last Pain:  Vitals:   07/10/20 1430  TempSrc: Temporal  PainSc: Asleep      Patients Stated Pain Goal: 0 (07/10/20 1350)  Complications: No complications documented.

## 2020-07-10 NOTE — Consult Note (Signed)
GI Inpatient Consult Note  Reason for Consult: Melena    Attending Requesting Consult: Dr. Eugenia Pancoast, MD  History of Present Illness: Nathaniel Villanueva is a 87 y.o. male seen for evaluation of melena at the request of Dr. Eugenia Pancoast. Pt has a PMH of persistent atrial fibrillation on chronic anticoagulation with Eliquis, CKD Stage IIIb, anemia of chronic disease with baseline hemoglobin 11.5-12, T2DM, HTN, and HLD. Pt presented to the [facility] ED last night for chief complaint of black, tarry stool that started three days prior. There was no complaint of associated GI symptoms of overt hematochezia, abdominal pain, nausea, vomiting, anorexia, early satiety, or unintentional weight loss. Upon presentation to the ED, he was found to have stable hemoglobin at 12.1 with normocytic indicies and otherwise normal electrolytes and liver enzymes. Per ED physician, he had dark stool on exam which was hemoccult negative. No prior history of GI bleed. He did have prior bidirectional endoscopy 02/2015 which showed erosive esophagitis as well as gastritis with colonoscopy showing internal hemorrhoids and tubular adenoma. He denies any frequent NSAID use. He does take Protonix 40 mg daily at home for management of GERD. No frequent breakthrough GERD symptoms. He denies any other concerning UGI symptoms such as dysphagia, odynophagia, or voice changes. He has not had any further episodes of melena since being here in the ED. Daughter, [name], reports she is unsure when his last dose of Eliquis was - she states she doesn't know if he took it yesterday morning or the night of 01/03. Pt denies taking any Pepto-Bismol or iron supplements.   Last EGD/Colonoscopy: 02/2015 (see above)  Past Medical History:  Past Medical History:  Diagnosis Date  . Anemia   . Diabetes mellitus without complication ([place])   . Hyperlipidemia   . Hypertension   . Hypoglycemia     Problem List: Patient Active Problem List   Diagnosis Date Noted  .  Acute upper GI bleeding 07/09/2020  . History of anemia due to chronic kidney disease 07/09/2020  . GERD (gastroesophageal reflux disease) 07/09/2020  . Persistent atrial fibrillation ([place]) 07/09/2020  . CKD (chronic kidney disease) stage 3, GFR 30-59 ml/min (HCC) 01/22/2019  . Constipation 01/22/2019  . Diabetic peripheral neuropathy ([place]) 01/22/2019  . Microalbuminuric diabetic nephropathy ([place]) 01/22/2019  . Bradycardia 09/23/2018  . Type 2 diabetes mellitus with stage 3 chronic kidney disease, without long-term current use of insulin ([place]) 09/27/2015  . Anxiety, mild 08/30/2015  . Syncope 04/05/2015  . Vitamin D deficiency 05/10/2014  . Hypoglycemia associated with diabetes ([place]) 02/01/2014  . Microalbuminuria 02/01/2014  . Anemia 01/02/2014  . Neck pain 01/02/2014  . HTN (hypertension) 01/02/2014  . Hypercholesterolemia 01/02/2014    Past Surgical History: Past Surgical History:  Procedure Laterality Date  . COLONOSCOPY WITH PROPOFOL N/A 03/04/2015   Procedure: COLONOSCOPY WITH PROPOFOL;  Surgeon: Lollie Sails, MD;  [facility]: [facility] ENDOSCOPY;  Service: Endoscopy;  Laterality: N/A;  . ESOPHAGOGASTRODUODENOSCOPY (EGD) WITH PROPOFOL N/A 03/04/2015   Procedure: ESOPHAGOGASTRODUODENOSCOPY (EGD) WITH PROPOFOL;  Surgeon: Lollie Sails, MD;  Location: [facility] ENDOSCOPY;  Service: Endoscopy;  Laterality: N/A;  . SKIN CANCER EXCISION      Allergies: Allergies  Allergen Reactions  . Lisinopril Other (See Comments)    Angioedema:  Per pt's office visit (wellness check) on 11/29.  MD changed BP med to Losartan as a result of "allergy" to Lisinopril  . Metoprolol     Home Medications: (Not in a hospital admission)  Home medication reconciliation was completed with  the patient.   Scheduled Inpatient Medications:   . finasteride  5 mg Oral Daily  . pantoprazole (PROTONIX) IV  40 mg Intravenous Q12H  . tiZANidine  2 mg Oral QHS    Continuous Inpatient Infusions:    PRN  Inpatient Medications:  acetaminophen **OR** acetaminophen  Family History: family history includes Hypertension in his mother.  The patient's family history is negative for inflammatory bowel disorders, GI malignancy, or solid organ transplantation.  Social History:   reports that he has quit smoking. He has never used smokeless tobacco. He reports that he does not drink alcohol and does not use drugs. The patient denies ETOH, tobacco, or drug use.   Review of Systems: Constitutional: Weight is stable.  Eyes: No changes in vision. ENT: No oral lesions, sore throat.  GI: see HPI.  Heme/Lymph: No easy bruising.  CV: No chest pain.  GU: No hematuria.  Integumentary: No rashes.  Neuro: No headaches.  Psych: No depression/anxiety.  Endocrine: No heat/cold intolerance.  Allergic/Immunologic: No urticaria.  Resp: No cough, SOB.  Musculoskeletal: No joint swelling.    Physical Examination: BP 140/78   Pulse (!) 45   Temp 98.5 F (36.9 C) (Oral)   Resp 15   SpO2 100%   Elderly male laying in ED stretcher. Able to answer most questions.  Gen: NAD, alert and oriented x 4 HEENT: PEERLA, EOMI, Neck: supple, no JVD or thyromegaly Chest: CTA bilaterally, no wheezes, crackles, or other adventitious sounds CV: RRR, no m/g/c/r Abd: soft, NT, ND, +BS in all four quadrants; no HSM, guarding, ridigity, or rebound tenderness Ext: no edema, well perfused with 2+ pulses, Skin: no rash or lesions noted Lymph: no LAD  Data: Lab Results  Component Value Date   WBC 3.7 (L) 07/10/2020   HGB 11.3 (L) 07/10/2020   HCT 33.5 (L) 07/10/2020   MCV 99.1 07/10/2020   PLT 144 (L) 07/10/2020   Recent Labs  Lab 07/09/20 1316 07/10/20 0329  HGB 12.1* 11.3*   Lab Results  Component Value Date   NA 141 07/10/2020   K 4.3 07/10/2020   CL 108 07/10/2020   CO2 25 07/10/2020   BUN 26 (H) 07/10/2020   CREATININE 1.73 (H) 07/10/2020   Lab Results  Component Value Date   ALT 12 07/10/2020   AST  23 07/10/2020   ALKPHOS 42 07/10/2020   BILITOT 1.0 07/10/2020   Recent Labs  Lab 07/10/20 0329  INR 1.3*   Assessment/Plan:  87 y/o AA male with a PMH of persistent atrial fibrillation on chronic anticoagulation with Eliquis, CKD Stage IIIb, anemia of chronic disease with baseline hemoglobin 11.5-12, T2DM, HTN, and HLD presented to the [facility] ED last night for melena x 3 days  1. Melena x 3 days - H&H stable at 11.3 with no signs of overt melena or hematochezia. Hemoccult Negative. Given patient's symptoms, concern for possible UGI bleed. DDx includes peptic ulcer disease, gastritis +/-, esophagitis, duodenitis, AVMs, malignancy, Dieulafoy's lesion, varices, etc  2. Asymptomatic bradycardia  3. Persistent atrial fibrillation - on chronic anticoagulation with Eliquis, last dose unknown (either yesterday morning or night of 01/03)  4. GERD - on Protonix 40 mg daily as outpatient, well-controlled  COVID-19 Test: NEGATIVE  Recommendations:  - H&H stable with no signs of overt gastrointestinal blood loss - Continue to closely monitor H&H and transfuse for Hgb <7.0. - Continue Protonix IV for acid suppression  - Discussed plan of care with patient's daughter, Lupita Leash, and we discussed options  of either watchful waiting versus definitive evaluation with endoscopy for luminal evaluation and potential endoscopic hemostasis.  - Plan for EGD this afternoon with Dr. Alice Reichert. Further recommendations after procedure - Remain NPO  I reviewed the risks (including bleeding, perforation, infection, anesthesia complications, cardiac/respiratory complications), benefits and alternatives of EGD. Patient and daughter consent to proceed.    Thank you for the consult. Please call with questions or concerns.  [facility] [phone] (Cell)

## 2020-07-10 NOTE — Anesthesia Procedure Notes (Signed)
Date/Time: 07/10/2020 2:27 PM Performed by: Junious Silk, CRNA Pre-anesthesia Checklist: Patient identified, Emergency Drugs available, Suction available, Patient being monitored and Timeout performed Oxygen Delivery Method: Nasal cannula

## 2020-07-10 NOTE — Anesthesia Preprocedure Evaluation (Signed)
Anesthesia Evaluation  Patient identified by MRN, date of birth, ID band Patient awake    Reviewed: Allergy & Precautions, H&P , NPO status , Patient's Chart, lab work & pertinent test results  Airway Mallampati: III  TM Distance: >3 FB Neck ROM: limited    Dental  (+) Edentulous Lower, Edentulous Upper   Pulmonary neg shortness of breath, former smoker,    Pulmonary exam normal        Cardiovascular Exercise Tolerance: Good hypertension, (-) angina(-) Past MI Normal cardiovascular exam+ dysrhythmias      Neuro/Psych PSYCHIATRIC DISORDERS  Neuromuscular disease negative psych ROS   GI/Hepatic Neg liver ROS, GERD  Medicated and Controlled,  Endo/Other  diabetes, Type 2  Renal/GU CRFRenal disease  negative genitourinary   Musculoskeletal   Abdominal   Peds  Hematology negative hematology ROS (+)   Anesthesia Other Findings Patient is NPO appropriate and reports no nausea or vomiting today.  Past Medical History: No date: Anemia No date: Diabetes mellitus without complication (HCC) No date: Dysrhythmia No date: Hyperlipidemia No date: Hypertension No date: Hypoglycemia  Past Surgical History: 03/04/2015: COLONOSCOPY WITH PROPOFOL; N/A     Comment:  Procedure: COLONOSCOPY WITH PROPOFOL;  Surgeon: Christena Deem, MD;  Location: [facility] ENDOSCOPY;  Service:               Endoscopy;  Laterality: N/A; 03/04/2015: ESOPHAGOGASTRODUODENOSCOPY (EGD) WITH PROPOFOL; N/A     Comment:  Procedure: ESOPHAGOGASTRODUODENOSCOPY (EGD) WITH               PROPOFOL;  Surgeon: Christena Deem, MD;  Location:               [facility] ENDOSCOPY;  Service: Endoscopy;  Laterality: N/A; No date: SKIN CANCER EXCISION     Reproductive/Obstetrics negative OB ROS                             Anesthesia Physical Anesthesia Plan  ASA: III  Anesthesia Plan: General   Post-op Pain Management:     Induction: Intravenous  PONV Risk Score and Plan: Propofol infusion and TIVA  Airway Management Planned: Natural Airway and Nasal Cannula  Additional Equipment:   Intra-op Plan:   Post-operative Plan:   Informed Consent: I have reviewed the patients History and Physical, chart, labs and discussed the procedure including the risks, benefits and alternatives for the proposed anesthesia with the patient or authorized representative who has indicated his/her understanding and acceptance.     Dental Advisory Given  Plan Discussed with: Anesthesiologist, CRNA and Surgeon  Anesthesia Plan Comments: (Patient consented for risks of anesthesia including but not limited to:  - adverse reactions to medications - risk of airway placement if required - damage to eyes, teeth, lips or other oral mucosa - nerve damage due to positioning  - sore throat or hoarseness - Damage to heart, brain, nerves, lungs, other parts of body or loss of life  Patient voiced understanding.)        Anesthesia Quick Evaluation

## 2020-07-10 NOTE — H&P (View-Only) (Signed)
GI Inpatient Consult Note  Reason for Consult: Melena    Attending Requesting Consult: Dr. Howerton, MD  History of Present Illness: Nathaniel Villanueva is a 87 y.o. male seen for evaluation of melena at the request of Dr. Howerton. Pt has a PMH of persistent atrial fibrillation on chronic anticoagulation with Eliquis, CKD Stage IIIb, anemia of chronic disease with baseline hemoglobin 11.5-12, T2DM, HTN, and HLD. Pt presented to the [facility] ED last night for chief complaint of black, tarry stool that started three days prior. There was no complaint of associated GI symptoms of overt hematochezia, abdominal pain, nausea, vomiting, anorexia, early satiety, or unintentional weight loss. Upon presentation to the ED, he was found to have stable hemoglobin at 12.1 with normocytic indicies and otherwise normal electrolytes and liver enzymes. Per ED physician, he had dark stool on exam which was hemoccult negative. No prior history of GI bleed. He did have prior bidirectional endoscopy 02/2015 which showed erosive esophagitis as well as gastritis with colonoscopy showing internal hemorrhoids and tubular adenoma. He denies any frequent NSAID use. He does take Protonix 40 mg daily at home for management of GERD. No frequent breakthrough GERD symptoms. He denies any other concerning UGI symptoms such as dysphagia, odynophagia, or voice changes. He has not had any further episodes of melena since being here in the ED. Daughter, [name], reports she is unsure when his last dose of Eliquis was - she states she doesn't know if he took it yesterday morning or the night of 01/03. Pt denies taking any Pepto-Bismol or iron supplements.   Last EGD/Colonoscopy: 02/2015 (see above)  Past Medical History:  Past Medical History:  Diagnosis Date  . Anemia   . Diabetes mellitus without complication (HCC)   . Hyperlipidemia   . Hypertension   . Hypoglycemia     Problem List: Patient Active Problem List   Diagnosis Date Noted  .  Acute upper GI bleeding 07/09/2020  . History of anemia due to chronic kidney disease 07/09/2020  . GERD (gastroesophageal reflux disease) 07/09/2020  . Persistent atrial fibrillation (HCC) 07/09/2020  . CKD (chronic kidney disease) stage 3, GFR 30-59 ml/min (HCC) 01/22/2019  . Constipation 01/22/2019  . Diabetic peripheral neuropathy (HCC) 01/22/2019  . Microalbuminuric diabetic nephropathy (HCC) 01/22/2019  . Bradycardia 09/23/2018  . Type 2 diabetes mellitus with stage 3 chronic kidney disease, without long-term current use of insulin (HCC) 09/27/2015  . Anxiety, mild 08/30/2015  . Syncope 04/05/2015  . Vitamin D deficiency 05/10/2014  . Hypoglycemia associated with diabetes (HCC) 02/01/2014  . Microalbuminuria 02/01/2014  . Anemia 01/02/2014  . Neck pain 01/02/2014  . HTN (hypertension) 01/02/2014  . Hypercholesterolemia 01/02/2014    Past Surgical History: Past Surgical History:  Procedure Laterality Date  . COLONOSCOPY WITH PROPOFOL N/A 03/04/2015   Procedure: COLONOSCOPY WITH PROPOFOL;  Surgeon: Martin U Skulskie, MD;  Location: [facility] ENDOSCOPY;  Service: Endoscopy;  Laterality: N/A;  . ESOPHAGOGASTRODUODENOSCOPY (EGD) WITH PROPOFOL N/A 03/04/2015   Procedure: ESOPHAGOGASTRODUODENOSCOPY (EGD) WITH PROPOFOL;  Surgeon: Martin U Skulskie, MD;  Location: [facility] ENDOSCOPY;  Service: Endoscopy;  Laterality: N/A;  . SKIN CANCER EXCISION      Allergies: Allergies  Allergen Reactions  . Lisinopril Other (See Comments)    Angioedema:  Per pt's office visit (wellness check) on 11/29.  MD changed BP med to Losartan as a result of "allergy" to Lisinopril  . Metoprolol     Home Medications: (Not in a hospital admission)  Home medication reconciliation was completed with   the patient.   Scheduled Inpatient Medications:   . finasteride  5 mg Oral Daily  . pantoprazole (PROTONIX) IV  40 mg Intravenous Q12H  . tiZANidine  2 mg Oral QHS    Continuous Inpatient Infusions:    PRN  Inpatient Medications:  acetaminophen **OR** acetaminophen  Family History: family history includes Hypertension in his mother.  The patient's family history is negative for inflammatory bowel disorders, GI malignancy, or solid organ transplantation.  Social History:   reports that he has quit smoking. He has never used smokeless tobacco. He reports that he does not drink alcohol and does not use drugs. The patient denies ETOH, tobacco, or drug use.   Review of Systems: Constitutional: Weight is stable.  Eyes: No changes in vision. ENT: No oral lesions, sore throat.  GI: see HPI.  Heme/Lymph: No easy bruising.  CV: No chest pain.  GU: No hematuria.  Integumentary: No rashes.  Neuro: No headaches.  Psych: No depression/anxiety.  Endocrine: No heat/cold intolerance.  Allergic/Immunologic: No urticaria.  Resp: No cough, SOB.  Musculoskeletal: No joint swelling.    Physical Examination: BP 140/78   Pulse (!) 45   Temp 98.5 F (36.9 C) (Oral)   Resp 15   SpO2 100%   Elderly male laying in ED stretcher. Able to answer most questions.  Gen: NAD, alert and oriented x 4 HEENT: PEERLA, EOMI, Neck: supple, no JVD or thyromegaly Chest: CTA bilaterally, no wheezes, crackles, or other adventitious sounds CV: RRR, no m/g/c/r Abd: soft, NT, ND, +BS in all four quadrants; no HSM, guarding, ridigity, or rebound tenderness Ext: no edema, well perfused with 2+ pulses, Skin: no rash or lesions noted Lymph: no LAD  Data: Lab Results  Component Value Date   WBC 3.7 (L) 07/10/2020   HGB 11.3 (L) 07/10/2020   HCT 33.5 (L) 07/10/2020   MCV 99.1 07/10/2020   PLT 144 (L) 07/10/2020   Recent Labs  Lab 07/09/20 1316 07/10/20 0329  HGB 12.1* 11.3*   Lab Results  Component Value Date   NA 141 07/10/2020   K 4.3 07/10/2020   CL 108 07/10/2020   CO2 25 07/10/2020   BUN 26 (H) 07/10/2020   CREATININE 1.73 (H) 07/10/2020   Lab Results  Component Value Date   ALT 12 07/10/2020   AST  23 07/10/2020   ALKPHOS 42 07/10/2020   BILITOT 1.0 07/10/2020   Recent Labs  Lab 07/10/20 0329  INR 1.3*   Assessment/Plan:  87 y/o AA male with a PMH of persistent atrial fibrillation on chronic anticoagulation with Eliquis, CKD Stage IIIb, anemia of chronic disease with baseline hemoglobin 11.5-12, T2DM, HTN, and HLD presented to the [facility] ED last night for melena x 3 days  1. Melena x 3 days - H&H stable at 11.3 with no signs of overt melena or hematochezia. Hemoccult Negative. Given patient's symptoms, concern for possible UGI bleed. DDx includes peptic ulcer disease, gastritis +/-, esophagitis, duodenitis, AVMs, malignancy, Dieulafoy's lesion, varices, etc  2. Asymptomatic bradycardia  3. Persistent atrial fibrillation - on chronic anticoagulation with Eliquis, last dose unknown (either yesterday morning or night of 01/03)  4. GERD - on Protonix 40 mg daily as outpatient, well-controlled  COVID-19 Test: NEGATIVE  Recommendations:  - H&H stable with no signs of overt gastrointestinal blood loss - Continue to closely monitor H&H and transfuse for Hgb <7.0. - Continue Protonix IV for acid suppression  - Discussed plan of care with patient's daughter, Lupita Leash, and we discussed options  of either watchful waiting versus definitive evaluation with endoscopy for luminal evaluation and potential endoscopic hemostasis.  - Plan for EGD this afternoon with Dr. Alice Reichert. Further recommendations after procedure - Remain NPO  I reviewed the risks (including bleeding, perforation, infection, anesthesia complications, cardiac/respiratory complications), benefits and alternatives of EGD. Patient and daughter consent to proceed.    Thank you for the consult. Please call with questions or concerns.  Reeves Forth [facility] [phone] (Cell)

## 2020-07-10 NOTE — ED Notes (Signed)
Pt resting comfortably at this time. All needs met at this time. Call bell in reach.  

## 2020-07-10 NOTE — Discharge Summary (Addendum)
Physician Discharge Summary   Nathaniel Villanueva  male DOB: 07-21-1930  [id number]  PCP: [facility]ons date: 07/09/2020 Discharge date: 07/10/2020  Admitted From: home Disposition:  home CODE STATUS: Full code  Discharge Instructions    Discharge instructions   Complete by: As directed    You have had an upper endoscopy, and GI doctor did not finding bleeding.    Your heart rate is low, but that's chronic, so our cardiologist is ok with you going home and follow up with Dr. Ubaldo Glassing.   Dr. Enzo [facility] Course:  For full details, please see H&P, progress notes, consult notes and ancillary notes.  Briefly,  Nathaniel Villanueva is a 87 y.o. male with medical history significant for persistent atrial fibrillation chronically anticoagulated on Eliquis, stage IIIb chronic kidney disease with baseline creatinine 1.4-1.65, anemia of chronic kidney disease with baseline hemoglobin 11.5-12, type 2 diabetes mellitus managed via lifestyle modifications in the setting of prior complicating hypoglycemia, hypertension, BPH, GERD, who was admitted to [facility] on 07/09/2020 with suspected acute upper GI bleed after presenting from home to [facility] Emergency Department complaining of dark-colored stool.   #) Reported melena Hgb 12.1 on presentation.  The patient is asymptomatic, HDS.  It appears that his most recent EGD occurred in August 2016 revealing esophagitis as well as gastritis.  most recent colonoscopy also occurred at that time and showed some internal hemorrhoids as well as a tubular adenoma of the colon.   Pt received EGD on 07/10/20 with finding of only gastritis, and was cleared to resume Eliquis and discharge after the procedure.  #) Bradycardia, chronic In May 2020 he reportedly underwent Holter monitoring, which detected longest pause of 2.6 seconds.  Most recent outpatient cardiology appointment in September 2021 noted that the patient has  been consistently asymptomatic with his bradycardia, which has persisted in spite of discontinuation of his metoprolol approximately 5 months ago.  Per review of his cardiology documentation, it appears that the ongoing plan from a cardiac standpoint is to continue to monitor is HR's given that he remains asymptomatic hemodynamically stable.  Consistent with this history, the patient remained asymptomatic and normotensive with his bradycardia even with heart rates into the high 30s at times.   I reached out to oncall cardiology who agreed that pt didn't need to be while inpatient and can continue to follow up with Dr. Ubaldo Glassing as outpatient.  #) Persistent atrial fibrillation on Eliquis HR remained low.  GI cleared pt to resume Eliquis after the EGD.  #) Hypertension:  Continued on amlodipine, losartan.  #) Stage IIIb chronic kidney disease creatinine range 1.4 -1.65.  Stable.  #) Anemia of chronic kidney disease Associated with baseline hemoglobin range of 11.5-12  #) GERD Continued home PPI  #) BPH Continued home finasteride    Discharge Diagnoses:  Principal Problem:   Acute upper GI bleeding Active Problems:   HTN (hypertension)   Type 2 diabetes mellitus with stage 3 chronic kidney disease, without long-term current use of insulin (HCC)   Bradycardia   CKD (chronic kidney disease) stage 3, GFR 30-59 ml/min (HCC)   History of anemia due to chronic kidney disease   GERD (gastroesophageal reflux disease)   Persistent atrial fibrillation [facility])    Discharge Instructions:  Allergies as of 07/10/2020      Reactions   Lisinopril Other (See Comments)   Angioedema:  Per pt's office visit (  wellness check) on 11/29.  MD changed BP med to Losartan as a result of "allergy" to Lisinopril   Metoprolol       Medication List    TAKE these medications   amLODipine 10 MG tablet Commonly known as: NORVASC Take 10 mg by mouth daily.   doxazosin 4 MG tablet Commonly known as:  CARDURA Take 4 mg by mouth daily.   Eliquis 2.5 MG Tabs tablet Generic drug: apixaban Take 2.5 mg by mouth 2 (two) times daily.   finasteride 5 MG tablet Commonly known as: PROSCAR Take 5 mg by mouth daily.   losartan 50 MG tablet Commonly known as: COZAAR Take 50 mg by mouth daily.   pantoprazole 40 MG tablet Commonly known as: PROTONIX Take 40 mg by mouth daily.   tiZANidine 2 MG tablet Commonly known as: ZANAFLEX Take 2 mg by mouth at bedtime.        Follow-up Information    [place] Schedule an appointment as soon as possible for a visit in 1 week(s).   Contact information: [place] [address] [phone]               Allergies  Allergen Reactions   Lisinopril Other (See Comments)    Angioedema:  Per pt's office visit (wellness check) on 11/29.  MD changed BP med to Losartan as a result of "allergy" to Lisinopril   Metoprolol      The results of significant diagnostics from this hospitalization (including imaging, microbiology, ancillary and laboratory) are listed below for reference.   Consultations:   Procedures/Studies: No results found.    Labs: BNP (last 3 results) No results for input(s): BNP in the last 8760 hours. Basic Metabolic Panel: Recent Labs  Lab 07/09/20 1316 07/10/20 0329  NA 142 141  K 4.1 4.3  CL 109 108  CO2 23 25  GLUCOSE 114* 116*  BUN 27* 26*  CREATININE 1.67* 1.73*  CALCIUM 9.1 8.7*  MG  --  2.1   Liver Function Tests: Recent Labs  Lab 07/09/20 1316 07/10/20 0329  AST 27 23  ALT 14 12  ALKPHOS 48 42  BILITOT 1.2 1.0  PROT 7.1 6.3*  ALBUMIN 4.1 3.6   No results for input(s): LIPASE, AMYLASE in the last 168 hours. No results for input(s): AMMONIA in the last 168 hours. CBC: Recent Labs  Lab 07/09/20 1316 07/10/20 0329  WBC 3.6* 3.7*  HGB 12.1* 11.3*  HCT 36.6* 33.5*  MCV 100.0 99.1  PLT 146* 144*   Cardiac Enzymes: No results for input(s): CKTOTAL,  CKMB, CKMBINDEX, TROPONINI in the last 168 hours. BNP: Invalid input(s): POCBNP CBG: Recent Labs  Lab 07/10/20 1345  GLUCAP 89   D-Dimer No results for input(s): DDIMER in the last 72 hours. Hgb A1c No results for input(s): HGBA1C in the last 72 hours. Lipid Profile No results for input(s): CHOL, HDL, LDLCALC, TRIG, CHOLHDL, LDLDIRECT in the last 72 hours. Thyroid function studies Recent Labs    07/10/20 0329  TSH 1.997   Anemia work up Recent Labs    07/09/20 1316  FERRITIN 109  TIBC 323  IRON 122   Urinalysis    Component Value Date/Time   COLORURINE YELLOW 07/31/2019 1215   APPEARANCEUR CLEAR 07/31/2019 1215   APPEARANCEUR Clear 10/01/2017 1013   LABSPEC 1.015 07/31/2019 1215   LABSPEC 1.004 01/25/2014 1651   PHURINE 5.0 07/31/2019 1215   GLUCOSEU NEGATIVE 07/31/2019 1215   GLUCOSEU Negative 01/25/2014 1651  HGBUR NEGATIVE 07/31/2019 1215   BILIRUBINUR NEGATIVE 07/31/2019 1215   BILIRUBINUR Negative 10/01/2017 1013   BILIRUBINUR Negative 01/25/2014 1651   KETONESUR NEGATIVE 07/31/2019 1215   PROTEINUR NEGATIVE 07/31/2019 1215   NITRITE NEGATIVE 07/31/2019 1215   LEUKOCYTESUR NEGATIVE 07/31/2019 1215   LEUKOCYTESUR Negative 01/25/2014 1651   Sepsis Labs Invalid input(s): PROCALCITONIN,  WBC,  LACTICIDVEN Microbiology Recent Results (from the past 240 hour(s))  Resp Panel by RT-PCR (Flu A&B, Covid) Nasopharyngeal Swab     Status: None   Collection Time: 07/09/20  7:38 PM   Specimen: Nasopharyngeal Swab; Nasopharyngeal(NP) swabs in vial transport medium  Result Value Ref Range Status   SARS Coronavirus 2 by RT PCR NEGATIVE NEGATIVE Final    Comment: (NOTE) SARS-CoV-2 target nucleic acids are NOT DETECTED.  The SARS-CoV-2 RNA is generally detectable in upper respiratory specimens during the acute phase of infection. The lowest concentration of SARS-CoV-2 viral copies this assay can detect is 138 copies/mL. A negative result does not preclude  SARS-Cov-2 infection and should not be used as the sole basis for treatment or other patient management decisions. A negative result may occur with  improper specimen collection/handling, submission of specimen other than nasopharyngeal swab, presence of viral mutation(s) within the areas targeted by this assay, and inadequate number of viral copies(<138 copies/mL). A negative result must be combined with clinical observations, patient history, and epidemiological information. The expected result is Negative.  Fact Sheet for Patients:  BloggerCourse.com  Fact Sheet for Healthcare Providers:  SeriousBroker.it  This test is no t yet approved or cleared by the [place] FDA and  has been authorized for detection and/or diagnosis of SARS-CoV-2 by FDA under an Emergency Use Authorization (EUA). This EUA will remain  in effect (meaning this test can be used) for the duration of the COVID-19 declaration under Section 564(b)(1) of the Act, 21 U.S.C.section 360bbb-3(b)(1), unless the authorization is terminated  or revoked sooner.       Influenza A by PCR NEGATIVE NEGATIVE Final   Influenza B by PCR NEGATIVE NEGATIVE Final    Comment: (NOTE) The Xpert Xpress SARS-CoV-2/FLU/RSV plus assay is intended as an aid in the diagnosis of influenza from Nasopharyngeal swab specimens and should not be used as a sole basis for treatment. Nasal washings and aspirates are unacceptable for Xpert Xpress SARS-CoV-2/FLU/RSV testing.  Fact Sheet for Patients: BloggerCourse.com  Fact Sheet for Healthcare Providers: SeriousBroker.it  This test is not yet approved or cleared by the [place] FDA and has been authorized for detection and/or diagnosis of SARS-CoV-2 by FDA under an Emergency Use Authorization (EUA). This EUA will remain in effect (meaning this test can be used) for the duration of  the COVID-19 declaration under Section 564(b)(1) of the Act, 21 U.S.C. section 360bbb-3(b)(1), unless the authorization is terminated or revoked.  Performed at [facility], [address] [place], [place] [address]      Total time spend on discharging this patient, including the last patient exam, discussing the hospital stay, instructions for ongoing care as it relates to all pertinent caregivers, as well as preparing the medical discharge records, prescriptions, and/or referrals as applicable, is 40 minutes.    Darlin Priestly, MD  [facility] 07/10/2020, 3:24 PM

## 2020-07-11 LAB — CALCIUM, IONIZED: Calcium, Ionized, Serum: 4.9 mg/dL (ref 4.5–5.6)

## 2020-07-11 LAB — PREPARE RBC (CROSSMATCH)

## 2020-07-11 NOTE — Anesthesia Postprocedure Evaluation (Signed)
Anesthesia Post Note  Patient: Nathaniel Villanueva  Procedure(s) Performed: ESOPHAGOGASTRODUODENOSCOPY (EGD) (N/A )  Patient location during evaluation: Endoscopy Anesthesia Type: General Level of consciousness: awake and alert Pain management: pain level controlled Vital Signs Assessment: post-procedure vital signs reviewed and stable Respiratory status: spontaneous breathing, nonlabored ventilation, respiratory function stable and patient connected to nasal cannula oxygen Cardiovascular status: blood pressure returned to baseline and stable Postop Assessment: no apparent nausea or vomiting Anesthetic complications: no   No complications documented.   Last Vitals:  Vitals:   07/10/20 1510 07/10/20 1520  BP: (!) 153/79 (!) 157/93  Pulse:    Resp:    Temp:    SpO2:      Last Pain:  Vitals:   07/10/20 1520  TempSrc:   PainSc: 0-No pain                 Cleda Mccreedy Dossie Swor

## 2020-07-28 NOTE — Progress Notes (Signed)
07/29/2020 10:15 AM   Nathaniel Villanueva Oct 21, 1930 [id number]  Referring provider: No referring provider defined for this encounter.  Chief Complaint  Patient presents with  . Hematuria   Urological history 1. High risk hematuria - former smoker - CT abdomen w/o in 05/2016 which noted suspected small gallstone dependently in gallbladder.  Multiple BILATERAL renal cysts bearing from stable in size to minimally larger since 2015.  Largest cyst is a complicated cyst at the mid LEFT kidney 11.5 x 8.3 x 11.1 cm containing a 10 x 8 mm high attenuation mural nodule which is slightly larger than the 10 x 6 mm seen on the previous exam.  Due to minimal enlargement of this mural nodule since previous exams, recommend characterization of this lesion by MR imaging with and without contrast.  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  MRI in 07/2016 noted renal lesions represent benign Bosniak category 1 and category 2 cysts. The area concerning for nodularity actually represents a small complex cyst and does not enhance at all. No worrisome renal lesion is identified - Cystoscopy in 09/2017 with Dr. Pilar Jarvis revealed a hypervascular prostate.   - Non contrast CT in 09/2018 revealed prostatic enlargement measuring 6.2 x 6.2 x 6.2 cm.  Multiple bilateral renal cysts. 1.2 cm hemorrhagic cyst over the lower pole left kidney.   - He was seen at [facility] on 10/06/2018 and urine cytology at that visit was negative.   He was also recommended to start on finasteride.   2. Bilateral renal cysts - MRI 2018 notes benign Bosniak 1 & 2 cysts  3. 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 male who presents today for a yearly follow up with his daughter, [name].   Patient denies any modifying or aggravating factors.  Patient denies any gross hematuria, dysuria or  suprapubic/flank pain.  Patient denies any fevers, chills, nausea or vomiting.     IPSS    Row Name 07/29/20 0900         International Prostate Symptom Score   How often have you had the sensation of not emptying your bladder? Not at All     How often have you had to urinate less than every two hours? Not at All     How often have you found you stopped and started again several times when you urinated? Not at All     How often have you found it difficult to postpone urination? Not at All     How often have you had a weak urinary stream? Not at All     How often have you had to strain to start urination? Not at All     How many times did you typically get up at night to urinate? None     Total IPSS Score 0           Quality of Life due to urinary symptoms   If you were to spend the rest of your life with your urinary condition just the way it is now how would you feel about that? Pleased            Score:  1-7 Mild 8-19 Moderate 20-35 Severe   PMH: Past Medical History:  Diagnosis Date  . Anemia   . Diabetes mellitus without complication ([place])   .  Dysrhythmia   . Hyperlipidemia   . Hypertension   . Hypoglycemia     Surgical History: Past Surgical History:  Procedure Laterality Date  . COLONOSCOPY WITH PROPOFOL N/A 03/04/2015   Procedure: COLONOSCOPY WITH PROPOFOL;  Surgeon: Lollie Sails, MD;  Location: [facility] ENDOSCOPY;  Service: Endoscopy;  Laterality: N/A;  . ESOPHAGOGASTRODUODENOSCOPY N/A 07/10/2020   Procedure: ESOPHAGOGASTRODUODENOSCOPY (EGD);  Surgeon: Toledo, Benay Pike, MD;  Location: [facility] ENDOSCOPY;  Service: Gastroenterology;  Laterality: N/A;  . ESOPHAGOGASTRODUODENOSCOPY (EGD) WITH PROPOFOL N/A 03/04/2015   Procedure: ESOPHAGOGASTRODUODENOSCOPY (EGD) WITH PROPOFOL;  Surgeon: Lollie Sails, MD;  Location: [facility] ENDOSCOPY;  Service: Endoscopy;  Laterality: N/A;  . SKIN CANCER EXCISION      Home Medications:  Allergies as of 07/29/2020      Reactions    Lisinopril Other (See Comments)   Angioedema:  Per pt's office visit (wellness check) on 11/29.  MD changed BP med to Losartan as a result of "allergy" to Lisinopril   Metoprolol       Medication List       Accurate as of July 29, 2020 10:15 AM. If you have any questions, ask your nurse or doctor.        STOP taking these medications   Eliquis 2.5 MG Tabs tablet Generic drug: apixaban Stopped by: Zara Council, PA-C     TAKE these medications   amLODipine 10 MG tablet Commonly known as: NORVASC Take 10 mg by mouth daily.   doxazosin 4 MG tablet Commonly known as: CARDURA Take 1 tablet (4 mg total) by mouth daily.   finasteride 5 MG tablet Commonly known as: PROSCAR Take 1 tablet (5 mg total) by mouth daily.   losartan 50 MG tablet Commonly known as: COZAAR Take 50 mg by mouth daily.   pantoprazole 40 MG tablet Commonly known as: PROTONIX Take 40 mg by mouth daily.   tiZANidine 2 MG tablet Commonly known as: ZANAFLEX Take 2 mg by mouth at bedtime.   Xarelto 15 MG Tabs tablet Generic drug: Rivaroxaban       Allergies:  Allergies  Allergen Reactions  . Lisinopril Other (See Comments)    Angioedema:  Per pt's office visit (wellness check) on 11/29.  MD changed BP med to Losartan as a result of "allergy" to Lisinopril  . Metoprolol     Family History: Family History  Problem Relation Age of Onset  . Hypertension Mother   . Prostate cancer Neg Hx   . Bladder Cancer Neg Hx   . Kidney cancer Neg Hx     Social History:  reports that he has quit smoking. He has never used smokeless tobacco. He reports that he does not drink alcohol and does not use drugs.  For pertinent review of systems please refer to history of present illness  Physical Exam: BP 121/71   Pulse 64   Ht 6' (1.829 m)   Wt 189 lb (85.7 kg)   BMI 25.63 kg/m   Constitutional:  Well nourished. Alert and oriented, No acute distress. HEENT: [place] AT, mask in place.  Trachea  midline Cardiovascular: No clubbing, cyanosis, or edema. Respiratory: Normal respiratory effort, no increased work of breathing. Neurologic: Grossly intact, no focal deficits, moving all 4 extremities. Psychiatric: Normal mood and affect.   Laboratory Data: Lab Results  Component Value Date   WBC 3.7 (L) 07/10/2020   HGB 11.3 (L) 07/10/2020   HCT 33.5 (L) 07/10/2020   MCV 99.1 07/10/2020   PLT 144 (L) 07/10/2020  Lab Results  Component Value Date   CREATININE 1.73 (H) 07/10/2020    Lab Results  Component Value Date   TSH 1.997 07/10/2020    Lab Results  Component Value Date   AST 23 07/10/2020   Lab Results  Component Value Date   ALT 12 07/10/2020   Specimen:  Blood  Ref Range & Units 1 mo ago  Hemoglobin A1C 4.2 - 5.6 % 6.1High   Average Blood Glucose (Calc) mg/dL 128   Resulting Agency  [facility]   Narrative  Normal Range:  4.2 - 5.6%  Increased Risk: 5.7 - 6.4%  Diabetes:    >= 6.5%  Glycemic Control for adults with diabetes: <7%  Specimen Collected: 06/03/20 10:54 AM Last Resulted: 06/03/20 4:23 PM  Received From: [facility]  Result Received: 07/09/20 12:56 PM   Specimen:  Blood  Ref Range & Units 1 mo ago  Cholesterol, Total 100 - 200 mg/dL 133   Triglyceride 35 - 199 mg/dL 52   HDL (High Density Lipoprotein) Cholesterol 29.0 - 71.0 mg/dL 47.9   LDL Calculated 0 - 130 mg/dL 75   VLDL Cholesterol mg/dL 10   Cholesterol/HDL Ratio  2.8   Resulting Agency  [facility]  Specimen Collected: 06/03/20 10:54 AM Last Resulted: 06/03/20 3:52 PM  Received From: [facility]  Result Received: 07/09/20 12:56 PM   Specimen:  Blood - Blood, Venous  Ref Range & Units 2 mo ago  WBC 3.8 - 10.8 Thousand/uL 2.8Low   RBC 4.20 - 5.80 Million/uL 3.62Low   Hemoglobin 13.2 - 17.1 g/dL 12.2Low   Hematocrit 38.5 - 50.0 % 35.7Low   MCV 80.0 - 100.0 fL 98.6   MCH 27.0 - 33.0 pg  33.7High   MCHC 32.0 - 36.0 g/dL 34.2   RDW 11.0 - 15.0 % 12.0   Platelets 140 - 400 Thousand/uL 148   MPV 7.5 - 12.5 fL 11.3   Resulting Agency  [facility]   Resulting Agency Comment  Performing Organization Information:   Site ID: [id number]   Name: [facility]   Address: [address] [place], [place] [address]   Director: Cathleen Fears [place] Specimen Collected: 05/01/20 10:36 AM Last Resulted: 05/02/20 1:09 PM  Received From: [facility]  Result Received: 07/09/20 12:56 PM   Specimen:  Blood - Blood, Venous  Ref Range & Units 2 mo ago Comments  Glucose 65 - 99 mg/dL 105High                                                Fasting reference interval                         For someone without known diabetes, a glucose value    between 100 and 125 mg/dL is consistent with    prediabetes and should be confirmed with a    follow-up test.      BUN 7 - 25 mg/dL 20    Creatinine 0.70 - 1.11 mg/dL 1.65High    For patients >76 years of age, the reference limit    for Creatinine is approximately 13% higher for people    identified as African-American.      eGFR Non-African > OR = 60 mL/min/1.49m 37Low  eGFR African > OR = 60 mL/min/1.88m 43Low    BUN/Creatinine Ratio 6 - 22 (calc) 12    Sodium 135 - 146 mmol/L 142    Potassium 3.5 - 5.3 mmol/L 4.0    Chloride 98 - 110 mmol/L 110    Bicarbonate (CO2) 20 - 32 mmol/L 24    Calcium 8.6 - 10.3 mg/dL 8.7    Phosphorus 2.1 - 4.3 mg/dL 2.9    Albumin 3.6 - 5.1 g/dL 4.1    Resulting Agency  [facility]    Resulting Agency Comment  Performing Organization Information:   Site ID: [id number]   Name: [facility]   Address: [address] [address]  Director: JCathleen FearsHessling Specimen Collected: 05/01/20 10:36 AM Last Resulted: 05/02/20 1:09 PM   Received From: [facility]  Result Received: 07/09/20 12:56 PM   Urinalysis Specimen:  Urine - Urine, Clean Catch  Ref Range & Units 2 mo ago  Color, Urine YELLOW YELLOW   Appearance Urine CLEAR CLEAR   Specific Gravity, UA 1.001 - 1.035 1.017   pH Urine 5.0 - 8.0 < OR = 5.0   Glucose, Ur NEGATIVE NEGATIVE   Ketones, Urine NEGATIVE NEGATIVE   Hemoglobin Ur Ql Strip NEGATIVE NEGATIVE   Protein, Ur NEGATIVE TRACEAbnormal   Resulting Agency  [facility]   Resulting Agency Comment  Performing Organization Information:   Site ID: [id number]   Name: [facility]   Address: [address] [id number]  Director: JCathleen FearsHessling Specimen Collected: 05/01/20 10:36 AM Last Resulted: 05/02/20 1:09 PM  [facility] From: [facility]  Result Received: 07/09/20 12:56 PM  I have reviewed the labs.  Pertinent Imaging Results for Iannone, DRISHIK TUBBY(MRN [id number] as of 07/29/2020 10:07  Ref. Range 07/29/2020 09:59  Scan Result Unknown 31 ml   Assessment & Plan:    1. High risk hematuria Hematuria work up completed in  09/2017- findings positive for bilateral renal cysts and a hypervascular prostate He has had recent gross hematuria which has stopped since starting the finasteride He has had non-contrast CT's, MRI and a cystoscopy which have not discovered any GU malignancies and a recent urine cytology was negative which is reassuring We discussed at a previous visit the fact that he hadn't had the recommended imaging of a CTU in the past, but he is not wanting to pursue it at this time.  Due to his age and co-morbidities, I found this reasonable.  He understands the risks of missing an urological tumor.   He denies any gross hematuria   2. BPH with LUTS IPSS score is 0/1, it is stable Continue conservative management, avoiding bladder irritants and timed voiding's Continue finasteride 5 mg daily and doxazosin 4 mg daily - refills  given RTC in 12 months for I PSS and PVR   Return in about 1 year (around 07/29/2021) for IPSS and PVR.  These notes generated with voice recognition software. I apologize for typographical errors.  SZara Council PA-C  [facility] [address] [place] [phone]

## 2020-07-29 ENCOUNTER — Encounter: Payer: Self-pay | Admitting: Urology

## 2020-07-29 ENCOUNTER — Ambulatory Visit (INDEPENDENT_AMBULATORY_CARE_PROVIDER_SITE_OTHER): Payer: Medicare Other | Admitting: Urology

## 2020-07-29 ENCOUNTER — Other Ambulatory Visit: Payer: Self-pay

## 2020-07-29 VITALS — BP 121/71 | HR 64 | Ht 72.0 in | Wt 189.0 lb

## 2020-07-29 DIAGNOSIS — R319 Hematuria, unspecified: Secondary | ICD-10-CM | POA: Diagnosis not present

## 2020-07-29 DIAGNOSIS — N138 Other obstructive and reflux uropathy: Secondary | ICD-10-CM | POA: Diagnosis not present

## 2020-07-29 DIAGNOSIS — N401 Enlarged prostate with lower urinary tract symptoms: Secondary | ICD-10-CM | POA: Diagnosis not present

## 2020-07-29 LAB — BLADDER SCAN AMB NON-IMAGING: Scan Result: 31

## 2020-07-29 MED ORDER — DOXAZOSIN MESYLATE 4 MG PO TABS: 4.0000 mg | ORAL_TABLET | Freq: Every day | ORAL | 3 refills | Status: DC

## 2020-07-29 MED ORDER — FINASTERIDE 5 MG PO TABS: 5.0000 mg | ORAL_TABLET | Freq: Every day | ORAL | 3 refills | Status: DC

## 2021-04-23 ENCOUNTER — Encounter: Payer: Self-pay | Admitting: Urology

## 2021-04-23 ENCOUNTER — Other Ambulatory Visit: Payer: Self-pay

## 2021-04-23 ENCOUNTER — Ambulatory Visit (INDEPENDENT_AMBULATORY_CARE_PROVIDER_SITE_OTHER): Payer: Medicare Other | Admitting: Urology

## 2021-04-23 VITALS — BP 177/70 | HR 67 | Ht 72.0 in | Wt 183.0 lb

## 2021-04-23 DIAGNOSIS — N138 Other obstructive and reflux uropathy: Secondary | ICD-10-CM

## 2021-04-23 DIAGNOSIS — N401 Enlarged prostate with lower urinary tract symptoms: Secondary | ICD-10-CM

## 2021-04-23 LAB — BLADDER SCAN AMB NON-IMAGING: Scan Result: 0

## 2021-04-23 NOTE — Progress Notes (Signed)
04/23/2021 4:02 PM   Nathaniel Villanueva [birth year] [id number]  Referring provider: [place],  [place] [address]  Chief Complaint  Patient presents with   Benign Prostatic Hypertrophy   Urological history 1. High risk hematuria - former smoker - CT abdomen w/o in 05/2016 which noted suspected small gallstone dependently in gallbladder.  Multiple BILATERAL renal cysts bearing from stable in size to minimally larger since 2015.  Largest cyst is a complicated cyst at the mid LEFT kidney 11.5 x 8.3 x 11.1 cm containing a 10 x 8 mm high attenuation mural nodule which is slightly larger than the 10 x 6 mm seen on the previous exam.  Due to minimal enlargement of this mural nodule since previous exams, recommend characterization of this lesion by MR imaging with and without contrast.  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  MRI in 07/2016 noted renal lesions represent benign Bosniak category 1 and category 2 cysts. The area concerning for nodularity actually represents a small complex cyst and does not enhance at all. No worrisome renal lesion is identified - Cystoscopy in 09/2017 with Dr. Pilar Jarvis revealed a hypervascular prostate.   - Non contrast CT in 09/2018 revealed prostatic enlargement measuring 6.2 x 6.2 x 6.2 cm.  Multiple bilateral renal cysts. 1.2 cm hemorrhagic cyst over the lower pole left kidney.   - He was seen at [facility] on 10/06/2018 and urine cytology at that visit was negative.   He was also recommended to start on finasteride.   2. Bilateral renal cysts - MRI 2018 notes benign Bosniak 1 & 2 cysts  3. BPH with LU TS - PVR's minimal  - managed with finasteride 5 mg daily   HPI: Nathaniel Villanueva is a 87 y.o. male who presents today for his bladder not feeling right his daughter, [name].   He called his daughter this morning stated his bladder did not feel right.  He  could not give his daughter any further description of what he was experiencing, but she went ahead and made an appointment for further evaluation.  This afternoon, he does not even recall calling his daughter this morning and denies any urinary symptoms at this time.  Patient denies any modifying or aggravating factors.  Patient denies any gross hematuria, dysuria or suprapubic/flank pain.  Patient denies any fevers, chills, nausea or vomiting.    PVR 0 mL   He could not provide urine specimen today.  PMH: Past Medical History:  Diagnosis Date   Anemia    Diabetes mellitus without complication ([place])    Dysrhythmia    Hyperlipidemia    Hypertension    Hypoglycemia     Surgical History: Past Surgical History:  Procedure Laterality Date   COLONOSCOPY WITH PROPOFOL N/A 03/04/2015   Procedure: COLONOSCOPY WITH PROPOFOL;  Surgeon: Lollie Sails, MD;  Location: [facility] ENDOSCOPY;  Service: Endoscopy;  Laterality: N/A;   ESOPHAGOGASTRODUODENOSCOPY N/A 07/10/2020   Procedure: ESOPHAGOGASTRODUODENOSCOPY (EGD);  Surgeon: Toledo, Benay Pike, MD;  Location: [facility] ENDOSCOPY;  Service: Gastroenterology;  Laterality: N/A;   ESOPHAGOGASTRODUODENOSCOPY (EGD) WITH PROPOFOL N/A 03/04/2015   Procedure: ESOPHAGOGASTRODUODENOSCOPY (EGD) WITH PROPOFOL;  Surgeon: Lollie Sails, MD;  Location: [facility] ENDOSCOPY;  Service: Endoscopy;  Laterality: N/A;   SKIN CANCER EXCISION      Home Medications:  Allergies as of 04/23/2021       Reactions   Lisinopril Other (See Comments)  Angioedema:  Per pt's office visit (wellness check) on 11/29.  MD changed BP med to Losartan as a result of "allergy" to Lisinopril   Metoprolol         Medication List        Accurate as of April 23, 2021  4:02 PM. If you have any questions, ask your nurse or doctor.          amLODipine 10 MG tablet Commonly known as: NORVASC Take 10 mg by mouth daily.   doxazosin 4 MG tablet Commonly known as: CARDURA Take 1  tablet (4 mg total) by mouth daily.   finasteride 5 MG tablet Commonly known as: PROSCAR Take 1 tablet (5 mg total) by mouth daily.   losartan 50 MG tablet Commonly known as: COZAAR Take 50 mg by mouth daily.   pantoprazole 40 MG tablet Commonly known as: PROTONIX Take 40 mg by mouth daily.   tiZANidine 2 MG tablet Commonly known as: ZANAFLEX Take 2 mg by mouth at bedtime.   Xarelto 15 MG Tabs tablet Generic drug: Rivaroxaban        Allergies:  Allergies  Allergen Reactions   Lisinopril Other (See Comments)    Angioedema:  Per pt's office visit (wellness check) on 11/29.  MD changed BP med to Losartan as a result of "allergy" to Lisinopril   Metoprolol     Family History: Family History  Problem Relation Age of Onset   Hypertension Mother    Prostate cancer Neg Hx    Bladder Cancer Neg Hx    Kidney cancer Neg Hx     Social History:  reports that he has quit smoking. He has never used smokeless tobacco. He reports that he does not drink alcohol and does not use drugs.  For pertinent review of systems please refer to history of present illness  Physical Exam: BP (!) 177/70   Pulse 67   Ht 6' (1.829 m)   Wt 183 lb (83 kg)   BMI 24.82 kg/m   Constitutional:  Well nourished. Alert and oriented, No acute distress. HEENT: [place] AT, mask in place.  Trachea midline Cardiovascular: No clubbing, cyanosis, or edema. Respiratory: Normal respiratory effort, no increased work of breathing. Neurologic: Grossly intact, no focal deficits, moving all 4 extremities. Psychiatric: Normal mood and affect.   Laboratory Data: N/A  Pertinent Imaging Results for [place], EFRAIM Villanueva (MRN [id number]) as of 04/23/2021 16:05  Ref. Range 04/23/2021 16:03  Scan Result Unknown 0    Assessment & Plan:    1. BPH with LU TS -PVR is 0 mL -patient denies any symptoms at this time -daughter will bring in urine specimen tomorrow  Return for pending UA .  These notes generated with voice  recognition software. I apologize for typographical errors.  Zara Council, PA-C  [facility] [address]  [place], [place] [address] [phone]

## 2021-04-24 ENCOUNTER — Other Ambulatory Visit: Payer: Medicare Other

## 2021-04-25 ENCOUNTER — Encounter: Payer: Self-pay | Admitting: Urology

## 2021-08-01 NOTE — Progress Notes (Signed)
08/04/2021 9:42 AM   Nathaniel Villanueva [birth year] [id number]  Referring provider: [facility] [place],  [place] [address]  Chief Complaint  Patient presents with   Follow-up    1 year follow-up   Urological history 1. High risk hematuria - former smoker - CT abdomen w/o in 05/2016 which noted suspected small gallstone dependently in gallbladder.  Multiple BILATERAL renal cysts bearing from stable in size to minimally larger since 2015.  Largest cyst is a complicated cyst at the mid LEFT kidney 11.5 x 8.3 x 11.1 cm containing a 10 x 8 mm high attenuation mural nodule which is slightly larger than the 10 x 6 mm seen on the previous exam.  Due to minimal enlargement of this mural nodule since previous exams, recommend characterization of this lesion by MR imaging with and without contrast.  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  MRI in 07/2016 noted renal lesions represent benign Bosniak category 1 and category 2 cysts. The area concerning for nodularity actually represents a small complex cyst and does not enhance at all. No worrisome renal lesion is identified - Cystoscopy in 09/2017 with Dr. Pilar Jarvis revealed a hypervascular prostate.   - Non contrast CT in 09/2018 revealed prostatic enlargement measuring 6.2 x 6.2 x 6.2 cm.  Multiple bilateral renal cysts. 1.2 cm hemorrhagic cyst over the lower pole left kidney.   - He was seen at [facility] on 10/06/2018 and urine cytology at that visit was negative.   He was also recommended to start on finasteride.  -no reports of gross heme  2. Bilateral renal cysts - MRI 2018 notes benign Bosniak 1 & 2 cysts  3. BPH with LU TS - PVR's minimal  - managed with finasteride 5 mg daily   HPI: Nathaniel Villanueva is a 88 y.o. male who presents today for his yearly office visit.  He has no urinary complaints at this time.  Patient denies any modifying or  aggravating factors.  Patient denies any gross hematuria, dysuria or suprapubic/flank pain.  Patient denies any fevers, chills, nausea or vomiting.    He has been taking the doxazosin 4 mg daily and finasteride 5 mg daily.    PMH: Past Medical History:  Diagnosis Date   Anemia    Diabetes mellitus without complication ([place])    Dysrhythmia    Hyperlipidemia    Hypertension    Hypoglycemia     Surgical History: Past Surgical History:  Procedure Laterality Date   COLONOSCOPY WITH PROPOFOL N/A 03/04/2015   Procedure: COLONOSCOPY WITH PROPOFOL;  Surgeon: Lollie Sails, MD;  [facility] ENDOSCOPY;  Service: Endoscopy;  Laterality: N/A;   ESOPHAGOGASTRODUODENOSCOPY N/A 07/10/2020   Procedure: ESOPHAGOGASTRODUODENOSCOPY (EGD);  Surgeon: Toledo, Benay Pike, MD;  Location: [facility] ENDOSCOPY;  Service: Gastroenterology;  Laterality: N/A;   ESOPHAGOGASTRODUODENOSCOPY (EGD) WITH PROPOFOL N/A 03/04/2015   Procedure: ESOPHAGOGASTRODUODENOSCOPY (EGD) WITH PROPOFOL;  Surgeon: Lollie Sails, MD;  Location: [facility] ENDOSCOPY;  Service: Endoscopy;  Laterality: N/A;   SKIN CANCER EXCISION      Home Medications:  Allergies as of 08/04/2021       Reactions   Lisinopril Other (See Comments), Swelling   Angioedema:  Per pt's office visit (wellness check) on 11/29.  MD changed BP med to Losartan as a result of "allergy" to Lisinopril Other reaction(s): Other (See Comments) Angioedema:  Per pt's office visit (wellness check) on 11/29.  MD changed BP  med to Losartan as a result of "allergy" to Lisinopril Swollen uvula and tonsils   Metoprolol         Medication List        Accurate as of August 04, 2021  9:42 AM. If you have any questions, ask your nurse or doctor.          amLODipine 10 MG tablet Commonly known as: NORVASC Take 10 mg by mouth daily.   doxazosin 4 MG tablet Commonly known as: CARDURA Take 1 tablet (4 mg total) by mouth daily.   finasteride 5 MG tablet Commonly known  as: PROSCAR Take 1 tablet (5 mg total) by mouth daily.   losartan 50 MG tablet Commonly known as: COZAAR Take 50 mg by mouth daily.   pantoprazole 40 MG tablet Commonly known as: PROTONIX Take 40 mg by mouth daily.   tiZANidine 2 MG tablet Commonly known as: ZANAFLEX Take 2 mg by mouth at bedtime.   Xarelto 15 MG Tabs tablet Generic drug: Rivaroxaban        Allergies:  Allergies  Allergen Reactions   Lisinopril Other (See Comments) and Swelling    Angioedema:  Per pt's office visit (wellness check) on 11/29.  MD changed BP med to Losartan as a result of "allergy" to Lisinopril Other reaction(s): Other (See Comments) Angioedema:  Per pt's office visit (wellness check) on 11/29.  MD changed BP med to Losartan as a result of "allergy" to Lisinopril Swollen uvula and tonsils   Metoprolol     Family History: Family History  Problem Relation Age of Onset   Hypertension Mother    Prostate cancer Neg Hx    Bladder Cancer Neg Hx    Kidney cancer Neg Hx     Social History:  reports that he has quit smoking. He has never used smokeless tobacco. He reports that he does not drink alcohol and does not use drugs.  For pertinent review of systems please refer to history of present illness  Physical Exam: BP (!) 168/99    Pulse 60    Ht 5\' 10"  (1.778 m)    Wt 183 lb (83 kg)    BMI 26.26 kg/m   Constitutional:  Well nourished. Alert and oriented, No acute distress. HEENT: [place] AT, mask in place.  Trachea midline Cardiovascular: No clubbing, cyanosis, or edema. Respiratory: Normal respiratory effort, no increased work of breathing. Neurologic: Grossly intact, no focal deficits, moving all 4 extremities. Psychiatric: Normal mood and affect.   Laboratory Data: Hemoglobin A1C 4.2 - 5.6 % 6.1 High    Average Blood Glucose (Calc) mg/dL 128   Resulting [place]  Narrative Performed by [facility] - LAB Normal Range:    4.2 - 5.6%  Increased Risk:   5.7 - 6.4%  Diabetes:        >= 6.5%  Glycemic Control for adults with diabetes:  <7%   Specimen Collected: 07/03/21 15:07 Last Resulted: 07/04/21 13:58  Received From: [place]  Result Received: 08/01/21 10:29   Cholesterol, Total 100 - 200 mg/dL 132   Triglyceride 35 - 199 mg/dL 62   HDL (High Density Lipoprotein) Cholesterol 29.0 - 71.0 mg/dL 44.4   LDL Calculated 0 - 130 mg/dL 75   VLDL Cholesterol mg/dL 12   Cholesterol/HDL Ratio  3.0   Resulting Agency  [facility] - LAB  Specimen Collected: 07/03/21 15:07 Last Resulted: 07/04/21 13:38  Received From: [facility]  Result Received: 08/01/21 10:29   Thyroid Stimulating Hormone (TSH) 0.450-5.330 uIU/ml uIU/mL 1.190   Resulting Agency  [facility] - LAB  Specimen Collected: 07/03/21 15:07 Last Resulted: 07/04/21 13:39  Received From: [facility]  Result Received: 08/01/21 10:29  I have reviewed the labs.    Pertinent Imaging N/A   Assessment & Plan:    1. BPH with LUTS -PVR's < 300 cc -continue conservative management, avoiding bladder irritants and timed voiding's -Continue doxazosin 4 mg daily and finasteride 5 mg daily-refills given  2. High risk hematuria -previous works up negative -no reports of gross heme   Return in about 1 year (around 08/04/2022) for IPSS and PVR.  These notes generated with voice recognition software. I apologize for typographical errors.  Zara Council, PA-C  [facility] [address]  [place], [place] [address] [phone]

## 2021-08-04 ENCOUNTER — Ambulatory Visit (INDEPENDENT_AMBULATORY_CARE_PROVIDER_SITE_OTHER): Payer: Medicare Other | Admitting: Urology

## 2021-08-04 ENCOUNTER — Encounter: Payer: Self-pay | Admitting: Urology

## 2021-08-04 ENCOUNTER — Other Ambulatory Visit: Payer: Self-pay

## 2021-08-04 VITALS — BP 168/99 | HR 60 | Ht 70.0 in | Wt 183.0 lb

## 2021-08-04 DIAGNOSIS — N138 Other obstructive and reflux uropathy: Secondary | ICD-10-CM

## 2021-08-04 DIAGNOSIS — R319 Hematuria, unspecified: Secondary | ICD-10-CM | POA: Diagnosis not present

## 2021-08-04 DIAGNOSIS — N401 Enlarged prostate with lower urinary tract symptoms: Secondary | ICD-10-CM | POA: Diagnosis not present

## 2021-08-04 MED ORDER — DOXAZOSIN MESYLATE 4 MG PO TABS: 4.0000 mg | ORAL_TABLET | Freq: Every day | ORAL | 3 refills | Status: DC

## 2021-08-04 MED ORDER — FINASTERIDE 5 MG PO TABS: 5.0000 mg | ORAL_TABLET | Freq: Every day | ORAL | 3 refills | Status: DC

## 2022-08-02 NOTE — Progress Notes (Unsigned)
08/03/2022 2:02 PM   Nathaniel Villanueva Sep 21, 1930 [id number]  Referring provider: [facility] [address] [place],  [place] [address]  Urological history 1. High risk hematuria - former smoker - CT abdomen w/o in 05/2016 which noted suspected small gallstone dependently in gallbladder.  Multiple BILATERAL renal cysts bearing from stable in size to minimally larger since 2015.  Largest cyst is a complicated cyst at the mid LEFT kidney 11.5 x 8.3 x 11.1 cm containing a 10 x 8 mm high attenuation mural nodule which is slightly larger than the 10 x 6 mm seen on the previous exam.  Due to minimal enlargement of this mural nodule since previous exams, recommend characterization of this lesion by MR imaging with and without contrast.  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  MRI in 07/2016 noted renal lesions represent benign Bosniak category 1 and category 2 cysts. The area concerning for nodularity actually represents a small complex cyst and does not enhance at all. No worrisome renal lesion is identified - Cystoscopy in 09/2017 with Dr. Pilar Jarvis revealed a hypervascular prostate.   - Non contrast CT in 09/2018 revealed prostatic enlargement measuring 6.2 x 6.2 x 6.2 cm.  Multiple bilateral renal cysts. 1.2 cm hemorrhagic cyst over the lower pole left kidney.   - He was seen at [facility] on 10/06/2018 and urine cytology at that visit was negative.   He was also recommended to start on finasteride.  -no reports of gross heme  2. Bilateral renal cysts - MRI 2018 notes benign Bosniak 1 & 2 cysts  3. BPH with LU TS - PVR's minimal  - managed with finasteride 5 mg daily   HPI: Nathaniel Villanueva is a 89 y.o. male who presents today for his yearly office visit.  PVR ***   PMH: Past Medical History:  Diagnosis Date   Anemia    Diabetes mellitus without complication ([place])    Dysrhythmia    Hyperlipidemia     Hypertension    Hypoglycemia     Surgical History: Past Surgical History:  Procedure Laterality Date   COLONOSCOPY WITH PROPOFOL N/A 03/04/2015   Procedure: COLONOSCOPY WITH PROPOFOL;  Surgeon: Lollie Sails, MD;  Location: [facility] ENDOSCOPY;  Service: Endoscopy;  Laterality: N/A;   ESOPHAGOGASTRODUODENOSCOPY N/A 07/10/2020   Procedure: ESOPHAGOGASTRODUODENOSCOPY (EGD);  Surgeon: Toledo, Benay Pike, MD;  Location: [facility] ENDOSCOPY;  Service: Gastroenterology;  Laterality: N/A;   ESOPHAGOGASTRODUODENOSCOPY (EGD) WITH PROPOFOL N/A 03/04/2015   Procedure: ESOPHAGOGASTRODUODENOSCOPY (EGD) WITH PROPOFOL;  Surgeon: Lollie Sails, MD;  Location: [facility] ENDOSCOPY;  Service: Endoscopy;  Laterality: N/A;   SKIN CANCER EXCISION      Home Medications:  Allergies as of 08/03/2022       Reactions   Lisinopril Other (See Comments), Swelling   Angioedema:  Per pt's office visit (wellness check) on 11/29.  MD changed BP med to Losartan as a result of "allergy" to Lisinopril Other reaction(s): Other (See Comments) Angioedema:  Per pt's office visit (wellness check) on 11/29.  MD changed BP med to Losartan as a result of "allergy" to Lisinopril Swollen uvula and tonsils   Metoprolol         Medication List        Accurate as of August 02, 2022  2:02 PM. If you have any questions, ask your nurse or doctor.          amLODipine 10 MG tablet Commonly known  as: NORVASC Take 10 mg by mouth daily.   doxazosin 4 MG tablet Commonly known as: CARDURA Take 1 tablet (4 mg total) by mouth daily.   finasteride 5 MG tablet Commonly known as: PROSCAR Take 1 tablet (5 mg total) by mouth daily.   losartan 50 MG tablet Commonly known as: COZAAR Take 50 mg by mouth daily.   pantoprazole 40 MG tablet Commonly known as: PROTONIX Take 40 mg by mouth daily.   tiZANidine 2 MG tablet Commonly known as: ZANAFLEX Take 2 mg by mouth at bedtime.   Xarelto 15 MG Tabs tablet Generic drug: Rivaroxaban         Allergies:  Allergies  Allergen Reactions   Lisinopril Other (See Comments) and Swelling    Angioedema:  Per pt's office visit (wellness check) on 11/29.  MD changed BP med to Losartan as a result of "allergy" to Lisinopril Other reaction(s): Other (See Comments) Angioedema:  Per pt's office visit (wellness check) on 11/29.  MD changed BP med to Losartan as a result of "allergy" to Lisinopril Swollen uvula and tonsils   Metoprolol     Family History: Family History  Problem Relation Age of Onset   Hypertension Mother    Prostate cancer Neg Hx    Bladder Cancer Neg Hx    Kidney cancer Neg Hx     Social History:  reports that he has quit smoking. He has never used smokeless tobacco. He reports that he does not drink alcohol and does not use drugs.  For pertinent review of systems please refer to history of present illness  Physical Exam: There were no vitals taken for this visit.  Constitutional:  Well nourished. Alert and oriented, No acute distress. HEENT: [place] AT, moist mucus membranes.  Trachea midline Cardiovascular: No clubbing, cyanosis, or edema. Respiratory: Normal respiratory effort, no increased work of breathing. Neurologic: Grossly intact, no focal deficits, moving all 4 extremities. Psychiatric: Normal mood and affect.    Laboratory Data: Serum creatinine (04/2022) 2.0 Hemoglobin A1c (04/2022) 6.0 I have reviewed the labs.    Pertinent Imaging ***   Assessment & Plan:    1. BPH with LUTS -PVR's < 300 cc -continue conservative management, avoiding bladder irritants and timed voiding's -Continue doxazosin 4 mg daily and finasteride 5 mg daily-refills given  2. High risk hematuria -previous works up negative -no reports of gross heme   No follow-ups on file.  These notes generated with voice recognition software. I apologize for typographical errors.  [place] [place] [address]  [place], [place] [address] [phone]

## 2022-08-03 ENCOUNTER — Ambulatory Visit (INDEPENDENT_AMBULATORY_CARE_PROVIDER_SITE_OTHER): Payer: Medicare Other | Admitting: Urology

## 2022-08-03 ENCOUNTER — Encounter: Payer: Self-pay | Admitting: Urology

## 2022-08-03 VITALS — BP 126/66 | HR 65 | Ht 70.0 in | Wt 183.0 lb

## 2022-08-03 DIAGNOSIS — R319 Hematuria, unspecified: Secondary | ICD-10-CM | POA: Diagnosis not present

## 2022-08-03 DIAGNOSIS — N138 Other obstructive and reflux uropathy: Secondary | ICD-10-CM | POA: Diagnosis not present

## 2022-08-03 DIAGNOSIS — N401 Enlarged prostate with lower urinary tract symptoms: Secondary | ICD-10-CM

## 2022-08-03 LAB — BLADDER SCAN AMB NON-IMAGING: Scan Result: 0

## 2022-08-03 MED ORDER — FINASTERIDE 5 MG PO TABS: 5.0000 mg | ORAL_TABLET | Freq: Every day | ORAL | 3 refills | Status: DC

## 2022-08-03 MED ORDER — DOXAZOSIN MESYLATE 4 MG PO TABS: 4.0000 mg | ORAL_TABLET | Freq: Every day | ORAL | 3 refills | Status: DC

## 2022-08-09 DIAGNOSIS — J189 Pneumonia, unspecified organism: Secondary | ICD-10-CM

## 2022-08-09 DIAGNOSIS — N179 Acute kidney failure, unspecified: Secondary | ICD-10-CM

## 2022-08-09 DIAGNOSIS — A419 Sepsis, unspecified organism: Secondary | ICD-10-CM

## 2022-08-09 DIAGNOSIS — J9691 Respiratory failure, unspecified with hypoxia: Secondary | ICD-10-CM

## 2022-08-14 DIAGNOSIS — I2510 Atherosclerotic heart disease of native coronary artery without angina pectoris: Secondary | ICD-10-CM

## 2022-08-14 DIAGNOSIS — J9691 Respiratory failure, unspecified with hypoxia: Secondary | ICD-10-CM

## 2022-08-14 DIAGNOSIS — I214 Non-ST elevation (NSTEMI) myocardial infarction: Secondary | ICD-10-CM

## 2022-08-14 DIAGNOSIS — J189 Pneumonia, unspecified organism: Secondary | ICD-10-CM

## 2022-08-28 ENCOUNTER — Telehealth (INDEPENDENT_AMBULATORY_CARE_PROVIDER_SITE_OTHER): Payer: Self-pay | Admitting: PULMONARY DISEASE

## 2022-08-28 DIAGNOSIS — J189 Pneumonia, unspecified organism: Secondary | ICD-10-CM

## 2022-10-14 ENCOUNTER — Encounter (INDEPENDENT_AMBULATORY_CARE_PROVIDER_SITE_OTHER): Payer: Self-pay | Admitting: PULMONARY DISEASE

## 2022-10-15 ENCOUNTER — Other Ambulatory Visit: Payer: Self-pay

## 2022-10-15 ENCOUNTER — Inpatient Hospital Stay (INDEPENDENT_AMBULATORY_CARE_PROVIDER_SITE_OTHER)
Admission: RE | Admit: 2022-10-15 | Discharge: 2022-10-15 | Disposition: A | Discharge status: Home / Self Care | Payer: Medicare (Managed Care) | Source: Ambulatory Visit | Attending: NURSE PRACTITIONER | Admitting: Radiology

## 2022-10-15 ENCOUNTER — Ambulatory Visit: Payer: Medicare (Managed Care) | Attending: NURSE PRACTITIONER | Admitting: NURSE PRACTITIONER

## 2022-10-15 ENCOUNTER — Encounter (INDEPENDENT_AMBULATORY_CARE_PROVIDER_SITE_OTHER): Payer: Self-pay | Admitting: NURSE PRACTITIONER

## 2022-10-15 VITALS — BP 110/80 | HR 97 | Temp 97.8°F | Resp 18 | Ht 72.0 in | Wt 180.0 lb

## 2022-10-15 DIAGNOSIS — I4891 Unspecified atrial fibrillation: Secondary | ICD-10-CM

## 2022-10-15 DIAGNOSIS — J189 Pneumonia, unspecified organism: Secondary | ICD-10-CM

## 2022-10-15 DIAGNOSIS — R0609 Other forms of dyspnea: Secondary | ICD-10-CM

## 2022-10-15 DIAGNOSIS — I2699 Other pulmonary embolism without acute cor pulmonale: Secondary | ICD-10-CM

## 2022-10-15 DIAGNOSIS — I2510 Atherosclerotic heart disease of native coronary artery without angina pectoris: Secondary | ICD-10-CM

## 2022-10-15 NOTE — Nursing Note (Signed)
Is the patient currently on oxygen therapy? No  What Liter Flow is the patient on? N/A  What DME company does the patient utilize for oxygen therapy? N/A  What is the patient on? n/a  What DME company does the patient utilize for CPAP/BiPAP/Trilogy? N/A  Smoking History:    Social History     Tobacco Use   Smoking Status Never   Smokeless Tobacco Never      Have you received a flu shot? Yes  Have you received a pneumonia shot? Yes  Has the patient had any tests performed since the last visit? None  If so, where were the test(s) performed? N/A  Has the patient had any recent hospitalizations? Yes  Does the patient have any other associated symptoms or modifying factors? Shortness of breath

## 2022-10-15 NOTE — Progress Notes (Signed)
[facility]  [address]  [place] [address]       Follow up/Progress Note    Patient Name: Jaime Deleon  Date: 10/15/2022  Department:  [facility]  MRN: [id number]  DOB: 1930-12-12  Primary Care Provider:  Teddy Spike, MD  Referring Provider:  Duard Brady, DO      Chief Complaint:   Chief Complaint   Patient presents with    Follow Up     Nursing Notes:   Cecile Sheerer, [place]  10/15/22 [id number]  Signed  Is the patient currently on oxygen therapy? No  What Liter Flow is the patient on? N/A  What DME company does the patient utilize for oxygen therapy? N/A  What is the patient on? n/a  What DME company does the patient utilize for CPAP/BiPAP/Trilogy? N/A  Smoking History:    Social History     Tobacco Use   Smoking Status Never   Smokeless Tobacco Never      Have you received a flu shot? Yes  Have you received a pneumonia shot? Yes  Has the patient had any tests performed since the last visit? None  If so, where were the test(s) performed? N/A  Has the patient had any recent hospitalizations? Yes  Does the patient have any other associated symptoms or modifying factors? Shortness of breath         HPI:    Patient with a history of CAD, CKD, CHF, AFib, history of TAVR, and PE who is here for new hospital follow up with CXR.      He was initially seen at [facility] on 08/09/2022. He presented to the ER in respiratory distress. PCR was positive for rhino/entervirus. CT chest on 08/12/2022 showed multifocal left-sided pneumonia with trace pleural effusions. He was treated with empiric antibiotics. He underwent LHC that showed severe CAD and had stent palced. He underwent TAVR after discharge with Dr. Janee Morn.     He was admitted again to [facility] on 09/18/2022.  He was diagnosed with sepsis and stool studies were positive for Norovirus.  CTA chest on 09/18/2022 showed small right and trace  left pleural effusion, artifact versus small segmental embolism in the right lower lobe, and no suspicious nodules appreciated. He remains on Eliquis. His most recent echo on 10/05/2022 showed EF of 55-60% and RVSP of 45 mmHg.     Today he reports his breathing has significantly improved since hospitalizations.   He has some mild shortness of breath with exertion.     He occasionally has difficulty maintaining sleep. He denies excessive daytime sleepiness, snoring, or waking up gasping for air.     Lifelong nonsmoker.        Past Medical History:  Past Medical History:   Diagnosis Date    AF (paroxysmal atrial fibrillation) (CMS HCC)     Alteration in cardiovascular system     Aortic stenosis     CAD (coronary artery disease)     Chronic congestive heart failure (CMS HCC)     Chronic kidney disease, stage III (moderate) (CMS HCC)     Dyslipidemia     History of transcatheter aortic valve replacement (TAVR)     Hypertension     Ischemic cardiomyopathy     Pulmonary emboli (CMS HCC)     Shortness of breath          Past Surgical History  Past Surgical History:  Procedure Laterality Date    HX CORONARY STENT PLACEMENT         Medication List  Current Outpatient Medications   Medication Sig    albuterol sulfate (PROVENTIL OR VENTOLIN OR PROAIR) 90 mcg/actuation Inhalation oral inhaler     aspirin (ECOTRIN) 81 mg Oral Tablet, Delayed Release (E.C.) Take 1 Tablet (81 mg total) by mouth Once a day    atorvastatin (LIPITOR) 40 mg Oral Tablet     clopidogreL (PLAVIX) 75 mg Oral Tablet Take 1 Tablet (75 mg total) by mouth Once a day    ELIQUIS 2.5 mg Oral Tablet     fluticasone propionate (FLONASE) 50 mcg/actuation Nasal Spray, Suspension SHAKE LIQUID AND USE 2 SPRAYS IN EACH NOSTRIL DAILY    levalbuterol HCL (XOPENEX) 0.31 mg/3 mL Inhalation Solution for Nebulization     melatonin 10 mg Oral Tablet 1 tablet BEDTIME (route: oral)    metoprolol succinate (TOPROL-XL) 50 mg Oral Tablet Sustained Release 24 hr     montelukast  (SINGULAIR) 10 mg Oral Tablet     oxyBUTYnin (DITROPAN XL) 5 mg Oral Tablet Extended Rel 24 hr     primidone (MYSOLINE) 50 mg Oral Tablet Take 1 Tablet (50 mg total) by mouth Every night    rosuvastatin (CRESTOR) 10 mg Oral Tablet Take 1 Tablet (10 mg total) by mouth Once a day    tamsulosin (FLOMAX) 0.4 mg Oral Capsule      Allergy List  Allergy History as of 10/15/22        No Allergies on File                  Family History   Family Medical History:       Problem Relation (Age of Onset)    No Known Problems Mother, Father            Social History  Social History     Socioeconomic History    Marital status: Married   Tobacco Use    Smoking status: Never    Smokeless tobacco: Never   Vaping Use    Vaping status: Never Used        Review of system  General:  Denies fever, chills, night sweats, fatigue, loss of appetite.  Neurological:  Denies headache, snoring.   Gastrointestinal:  Denies reflux, heartburn, diarrhea.  Cardiovascular:  Denies chest pain, irregular heartbeats.  Respiratory:  See HPI  Musculoskeletal:  Denies joint pain, restless legs.  Endocrine/Metabolic:  Denies weight gain, weight loss.  Immunologic:  Denies sinus allergy symptoms.  Mental Status/Psychiatric:  Denies anxiety, depression.  ENT:  Denies nasal congestion, hoarseness, postnasal drip.  Integumentary:  Denies rash, new skin lesions.    Objective:  Vital Signs  BP 110/80 (Site: Right, Patient Position: Sitting, Cuff Size: Adult)   Pulse 97   Temp 36.6 C (97.8 F)   Resp 18   Ht 1.829 m (6')   Wt 81.6 kg (180 lb)   SpO2 96%   BMI 24.41 kg/m          PHYSICAL EXAMINATION:   Constitutional:  Vital signs stable.  General appearance of the patient:  Alert, no acute distress.  Normal appearance, well nourished.  Eyes: PERRLA and normal eye lids.  Conjunctiva normal.  Ears, Nose, Mouth, and Throat: External inspection of ears and nose with normal appearance.  Inspection of lips, teeth and gums with normal appearance and oral mucosa  normal.  Neck: Supple with trachea midline.  Respiratory:  Auscultation  of lungs with normal breath sounds, no rales, no rhonchi, no wheezing.  Respiratory effort with no tractions, breathing regular and unlabored.  Cardiovascular:  Regular rhythm and regular rate.    Gastrointestinal: Abdomen non-distended.  Musculoskeletal:  Normal gait and station, normal digits, no digital cyanosis or clubbing.  Mental Status/Psychiatric:  Alert, grossly oriented to person, place, and time.  Appropriate and normal mood.    Assessment    ICD-10-CM    1. Pulmonary embolism, unspecified chronicity, unspecified pulmonary embolism type, unspecified whether acute cor pulmonale present (CMS HCC)  I26.99       2. Dyspnea on exertion  R06.09       3. Atrial fibrillation (CMS HCC)  I48.91       4. Coronary artery disease, unspecified vessel or lesion type, unspecified whether angina present, unspecified whether native or transplanted heart  I25.10           Imaging  XR CHEST PA AND LATERAL  Narrative: Jasdeep LEE Zeisler    XR CHEST PA AND LATERAL 10/15/2022 8:48 AM    J18.9: Pneumonia f/u pna    2 views.  Status post valve replacement, indeed noted 09/20/2022.  Scarring within the left mid lower lung, suspect several parenchymal clips.  Right lung is clear.  Degenerative disease involving the spine.  Impression: No acute abnormality    Radiologist location ID: [id number]      Plan  Instructions  Continue medications as prescribed/directed unless changed by provider.  Plan of care discussed with patient.    CXR today was independently reviewed and discussed with patient.  There was no acute abnormality.  He was scarring within the left mid and lower lung.    Patient's breathing has significantly improved after hospitalization.     Recommended patient take over-the-counter melatonin for difficulty maintaining sleep.  Advised he not nap during the day.    Continue to follow with Dr. Janee Morn s/p TAVR.  Continue Eliquis.    Will make patient PRN.  Advised patient to contact the office if he has any needs.     Patient was seen and examined by Dr. Mordecai Maes.  He was present for the majority of the visit.  He is in agreement with the note and plan unless otherwise noted on his addendum.     Return if symptoms worsen or fail to improve.     The patient was given the opportunity to ask questions and those questions were answered to the patient's satisfaction. The patient was encouraged to call with any additional questions or concerns. Discussed with the patient effects and side effects of medications. Medication safety was discussed.  The patient was informed to contact the office within 7 business days if a message/lab results/referral/imaging results have not been conveyed to the patient.    Electronically signed by Hezzie Bump, FNP-C    Pulmonary attending:  Chest x-ray personally reviewed by me and discussed with the patient in detail  No acute process identified   Pulmonary status remains fairly stable after hospitalization   Doing well after valvular replacement   Continue blood thinners   We will follow up p.r.n.   To call with any issues     I provided direct patient care for this patient.   Patient seen and examined personally by me.   Pertinent labs and images/reports were reviewed personally by me if available.  Chart records/history personally reviewed by me  Assessment and plan of care reviewed in detail and discussed with the patient  and mid-level provider.   I performed majority of Medical Decision Making of this visit and participated in the data collection with the mid-level provider.   Patient has chronic illness requiring review of testing, ordering of follow-up testing, and interpretation of test results.  Acute problems were also addressed during this visit.      Duard Brady, DO  10/15/2022 11:13    This note may have been partially generated using MModal Fluency Direct system, and there may be some incorrect words, spellings, and  punctuation that were not noted in checking the note before saving.

## 2022-11-27 ENCOUNTER — Emergency Department
Admission: EM | Admit: 2022-11-27 | Discharge: 2022-11-27 | Disposition: A | Discharge status: Home / Self Care | Payer: Medicare Other | Attending: Student in an Organized Health Care Education/Training Program | Admitting: Student in an Organized Health Care Education/Training Program

## 2022-11-27 ENCOUNTER — Emergency Department: Payer: Medicare Other

## 2022-11-27 ENCOUNTER — Other Ambulatory Visit: Payer: Self-pay

## 2022-11-27 DIAGNOSIS — R109 Unspecified abdominal pain: Secondary | ICD-10-CM | POA: Diagnosis present

## 2022-11-27 DIAGNOSIS — K5900 Constipation, unspecified: Secondary | ICD-10-CM | POA: Diagnosis not present

## 2022-11-27 LAB — COMPREHENSIVE METABOLIC PANEL
ALT: 11 U/L (ref 0–44)
AST: 24 U/L (ref 15–41)
Albumin: 4.2 g/dL (ref 3.5–5.0)
Alkaline Phosphatase: 54 U/L (ref 38–126)
Anion gap: 10 (ref 5–15)
BUN: 16 mg/dL (ref 8–23)
CO2: 25 mmol/L (ref 22–32)
Calcium: 8.8 mg/dL — ABNORMAL LOW (ref 8.9–10.3)
Chloride: 107 mmol/L (ref 98–111)
Creatinine, Ser: 1.64 mg/dL — ABNORMAL HIGH (ref 0.61–1.24)
GFR, Estimated: 39 mL/min — ABNORMAL LOW (ref >60)
Glucose, Bld: 104 mg/dL — ABNORMAL HIGH (ref 70–99)
Potassium: 3.4 mmol/L — ABNORMAL LOW (ref 3.5–5.1)
Sodium: 142 mmol/L (ref 135–145)
Total Bilirubin: 1.2 mg/dL (ref 0.3–1.2)
Total Protein: 6.8 g/dL (ref 6.5–8.1)

## 2022-11-27 LAB — CBC
HCT: 37.3 % — ABNORMAL LOW (ref 39.0–52.0)
Hemoglobin: 11.9 g/dL — ABNORMAL LOW (ref 13.0–17.0)
MCH: 32.3 pg (ref 26.0–34.0)
MCHC: 31.9 g/dL (ref 30.0–36.0)
MCV: 101.4 fL — ABNORMAL HIGH (ref 80.0–100.0)
Platelets: 140 10*3/uL — ABNORMAL LOW (ref 150–400)
RBC: 3.68 MIL/uL — ABNORMAL LOW (ref 4.22–5.81)
RDW: 12.4 % (ref 11.5–15.5)
WBC: 3.1 10*3/uL — ABNORMAL LOW (ref 4.0–10.5)
nRBC: 0 % (ref 0.0–0.2)

## 2022-11-27 LAB — LIPASE, BLOOD: Lipase: 79 U/L — ABNORMAL HIGH (ref 11–51)

## 2022-11-27 MED ORDER — LACTULOSE 10 GM/15ML PO SOLN
30.0000 g | Freq: Once | ORAL | Status: AC
  Administered 2022-11-27: 30 g via ORAL
  Filled 2022-11-27: qty 60

## 2022-11-27 NOTE — ED Notes (Signed)
RN Adam informed of pt in room and what was needed.

## 2022-11-27 NOTE — ED Notes (Signed)
Pt attempted to have a BM without success on the commode. Pt states he is having rectal pain and fullness at this time.   Spoke with pt's daughter and she reported that she called to have pt transported because pt is c/o unable to have a BM. States pt had an episode 3 months ago that was relieved with Miralax.

## 2022-11-27 NOTE — ED Triage Notes (Addendum)
Pt comes via EMs from home with c/o lower belly swelling. Pt denies any pain. Pt states he is unsure of last time he urinated. Pt states BM yesterday. Pt denies any N/V.  Pt now states he didn't say anything about swelling. Pt states he doesn't know why he came to ED today.

## 2022-11-27 NOTE — ED Notes (Signed)
Pt provided discharge instructions and prescription information. Pt was given the opportunity to ask questions and questions were answered.   

## 2022-11-27 NOTE — ED Provider Notes (Signed)
[facility] Provider Note    None    (approximate)   History   Abdominal Pain   HPI  Nathaniel Villanueva is a 89 y.o. male presents to the ER due to concern for having trouble moving his bowels.  He denies any pain or discomfort.  He is uncertain as to why EMS brought him to the hospital no report of any falls.  I spoke with the patient's POA/daughter, Lupita Leash, who states that she typically will manage this at home but does not currently have transportation and could not see him.  States that he will get constipated from time to time is never had an obstruction or impaction.  No other concerns noted or reported.     Physical Exam   Triage Vital Signs: ED Triage Vitals  Enc Vitals Group     BP 11/27/22 1118 (!) 146/65     Pulse Rate 11/27/22 1118 (!) 52     Resp 11/27/22 1118 12     Temp 11/27/22 1118 98.7 F (37.1 C)     Temp src --      SpO2 11/27/22 1118 100 %     Weight --      Height --      Head Circumference --      Peak Flow --      Pain Score 11/27/22 1117 0     Pain Loc --      Pain Edu? --      Excl. in GC? --     Most recent vital signs: Vitals:   11/27/22 1118  BP: (!) 146/65  Pulse: (!) 52  Resp: 12  Temp: 98.7 F (37.1 C)  SpO2: 100%     Constitutional: Alert  Eyes: Conjunctivae are normal.  Head: Atraumatic. Nose: No congestion/rhinnorhea. Mouth/Throat: Mucous membranes are moist.   Neck: Painless ROM.  Cardiovascular:   Good peripheral circulation. Respiratory: Normal respiratory effort.  No retractions.  Gastrointestinal: Soft and nontender.  Musculoskeletal:  no deformity Neurologic:  MAE spontaneously. No gross focal neurologic deficits are appreciated.  Skin:  Skin is warm, dry and intact. No rash noted. Psychiatric: Mood and affect are normal. Speech and behavior are normal.    ED Results / Procedures / Treatments   Labs (all labs ordered are listed, but only abnormal results are displayed) Labs Reviewed   LIPASE, BLOOD - Abnormal; Notable for the following components:      Result Value   Lipase 79 (*)    All other components within normal limits  COMPREHENSIVE METABOLIC PANEL - Abnormal; Notable for the following components:   Potassium 3.4 (*)    Glucose, Bld 104 (*)    Creatinine, Ser 1.64 (*)    Calcium 8.8 (*)    GFR, Estimated 39 (*)    All other components within normal limits  CBC - Abnormal; Notable for the following components:   WBC 3.1 (*)    RBC 3.68 (*)    Hemoglobin 11.9 (*)    HCT 37.3 (*)    MCV 101.4 (*)    Platelets 140 (*)    All other components within normal limits  URINALYSIS, ROUTINE W REFLEX MICROSCOPIC     EKG     RADIOLOGY  Please see ED Course for my review and interpretation.  I personally reviewed all radiographic images ordered to evaluate for the above acute complaints and reviewed radiology reports and findings.  These findings were personally discussed with the patient.  Please  see medical record for radiology report.   PROCEDURES:  Critical Care performed:   Procedures   MEDICATIONS ORDERED IN ED: Medications  lactulose (CHRONULAC) 10 GM/15ML solution 30 g (30 g Oral Given 11/27/22 1254)     IMPRESSION / MDM / ASSESSMENT AND PLAN / ED COURSE  I reviewed the triage vital signs and the nursing notes.                              Differential diagnosis includes, but is not limited to, constipation, enteritis, obstruction, electrolyte abnormality  Patient presenting to the ER for evaluation of symptoms as described above.  Based on symptoms, risk factors and considered above differential, this presenting complaint could reflect a potentially life-threatening illness therefore the patient will be placed on continuous pulse oximetry and telemetry for monitoring.  Laboratory evaluation will be sent to evaluate for the above complaints.      Clinical Course as of 11/27/22 1317  Fri Nov 27, 2022  1314 Patient had bowel movement  with significant improvement in symptoms.  Not consistent with impaction.  Blood work at baseline.  Patient does appear stable and appropriate for discharge home. [PR]    Clinical Course User Index [PR] Willy Eddy, MD     FINAL CLINICAL IMPRESSION(S) / ED DIAGNOSES   Final diagnoses:  Constipation, unspecified constipation type     Rx / DC Orders   ED Discharge Orders     None        Note:  This document was prepared using Dragon voice recognition software and may include unintentional dictation errors.    Willy Eddy, MD 11/27/22 [phone]

## 2022-11-27 NOTE — ED Notes (Addendum)
Pt states he was able to go to the commode and have a BM. Pt denies rectal pain or fullness at this time. Unwitnessed by this RN, but pt appears more comfortable at this time. Pt instructed not to get up without calling for help. Bed exit alarm activated.   Pt also stated he voided at that time and is unable to provide a urine specimen at this time.

## 2023-03-19 ENCOUNTER — Other Ambulatory Visit: Payer: Self-pay

## 2023-03-19 ENCOUNTER — Emergency Department
Admission: EM | Admit: 2023-03-19 | Discharge: 2023-03-19 | Disposition: A | Discharge status: Home / Self Care | Payer: Medicare Other | Attending: Emergency Medicine | Admitting: Emergency Medicine

## 2023-03-19 DIAGNOSIS — Z7901 Long term (current) use of anticoagulants: Secondary | ICD-10-CM | POA: Diagnosis not present

## 2023-03-19 DIAGNOSIS — K625 Hemorrhage of anus and rectum: Secondary | ICD-10-CM | POA: Insufficient documentation

## 2023-03-19 LAB — BASIC METABOLIC PANEL
Anion gap: 8 (ref 5–15)
BUN: 29 mg/dL — ABNORMAL HIGH (ref 8–23)
CO2: 24 mmol/L (ref 22–32)
Calcium: 8.5 mg/dL — ABNORMAL LOW (ref 8.9–10.3)
Chloride: 105 mmol/L (ref 98–111)
Creatinine, Ser: 1.89 mg/dL — ABNORMAL HIGH (ref 0.61–1.24)
GFR, Estimated: 33 mL/min — ABNORMAL LOW (ref >60)
Glucose, Bld: 94 mg/dL (ref 70–99)
Potassium: 4.4 mmol/L (ref 3.5–5.1)
Sodium: 137 mmol/L (ref 135–145)

## 2023-03-19 LAB — CBC
HCT: 37.2 % — ABNORMAL LOW (ref 39.0–52.0)
Hemoglobin: 12.2 g/dL — ABNORMAL LOW (ref 13.0–17.0)
MCH: 32.7 pg (ref 26.0–34.0)
MCHC: 32.8 g/dL (ref 30.0–36.0)
MCV: 99.7 fL (ref 80.0–100.0)
Platelets: 138 10*3/uL — ABNORMAL LOW (ref 150–400)
RBC: 3.73 MIL/uL — ABNORMAL LOW (ref 4.22–5.81)
RDW: 12.8 % (ref 11.5–15.5)
WBC: 6 10*3/uL (ref 4.0–10.5)
nRBC: 0 % (ref 0.0–0.2)

## 2023-03-19 NOTE — ED Provider Notes (Signed)
[facility] Provider Note   Event Date/Time   First MD Initiated Contact with Patient 03/19/23 [phone]     (approximate)  History   Rectal Bleeding  HPI  Nathaniel Villanueva is a 89 y.o. male with a history of atrial fibrillation on Xarelto  Today patient called his daughter reporting that he noticed he had blood on his toilet paper after use the bathroom.  He does not believe in that there was necessarily blood in his stool as much is no signs of wiping, but he is not certain.  Daughter also reports that she showed him and there was blood on his tissue paper but she did not the opportunity to observe his stool.  He has not had any pain no nausea vomiting he has not felt weak tired fatigued or short of breath.  He does take Xarelto  Does have a history of frequent constipation.  He has not had any difficulty with constipation today  They report a few years ago he got admitted for dark stool.  As far as they are aware he has not had any dark or black stool accompanying this but noticed blood on toilet paper today   On Xarelto   Noted in January 2022, reviewed note from discharge summary that notes the patient had an upper endoscopy but did not find any bleeding  Physical Exam   Triage Vital Signs: ED Triage Vitals  Encounter Vitals Group     BP      Systolic BP Percentile      Diastolic BP Percentile      Pulse      Resp      Temp      Temp src      SpO2      Weight      Height      Head Circumference      Peak Flow      Pain Score      Pain Loc      Pain Education      Exclude from Growth Chart     Most recent vital signs: Vitals:   03/19/23 0907 03/19/23 1051  BP: (!) 147/72 129/79  Pulse: 68 71  Resp: 12 16  Temp: 97.9 F (36.6 C)   SpO2: 100% 98%     General: Awake, no distress.  Very pleasant daughter at bedside very pleasant CV:  Good peripheral perfusion.  Normal tones, slightly irregular but normal rate Resp:  Normal effort.  Clear  bilateralAbd:  No distention.  Abdomen soft nontender nondistended throughout.  Rectal exam performed with nurse Misty Stanley present.  Small external hemorrhoid without obvious bleeding.  Internal rectal exam demonstrates brown to slightly yellowish colored stool no black stool.  Small about 1 drop of blood is noted within the rectal vault when withdrawing my finger.  No pain or discomfort Other:     ED Results / Procedures / Treatments   Labs (all labs ordered are listed, but only abnormal results are displayed) Labs Reviewed  CBC - Abnormal; Notable for the following components:      Result Value   RBC 3.73 (*)    Hemoglobin 12.2 (*)    HCT 37.2 (*)    Platelets 138 (*)    All other components within normal limits  BASIC METABOLIC PANEL - Abnormal; Notable for the following components:   BUN 29 (*)    Creatinine, Ser 1.89 (*)    Calcium 8.5 (*)  GFR, Estimated 33 (*)    All other components within normal limits     EKG  Interpreted by me at 910 heart rate 60 QRS 130 QTc 420 atrial fibrillation rate controlled.  Right bundle branch block   RADIOLOGY     PROCEDURES:  Critical Care performed: No  Procedures   MEDICATIONS ORDERED IN ED: Medications - No data to display   IMPRESSION / MDM / ASSESSMENT AND PLAN / ED COURSE  I reviewed the triage vital signs and the nursing notes.                              Differential diagnosis includes, but is not limited to, painless rectal bleeding sources considered included hemorrhoids, internal hemorrhoid, polyp, mass, diverticulosis, AVM etc.  At this point the patient is hemodynamically normal, he does not describe a scenario of large amount of bleeding but does report seeing blood on tissue paper after normal bowel movement this morning.  He has no abdominal pain.  I suspect this is likely minor, as his stool was also noted to be normal in color but a small drop of blood is present on rectal exam in the vault.  Will check  hemoglobin, given the patient's significantly elevated risk for stroke and suspicion for a relatively low risk bleeding scenario no major hemorrhage evident at this time, we will continue him on his Xarelto with plan to follow-up with GI if his hemoglobin is stable.  His daughter is already made contact with the GI clinic with the plan to set up close follow-up with [facility]  Careful return precautions reviewed with patient and his daughter including signs and symptoms suggestive of increased bleeding, fatigue anemia, etc. that would need to prompt his emergent return if present  Patient's presentation is most consistent with acute complicated illness / injury requiring diagnostic workup.   Diagnostic testing reveals normal hemoglobin.  Discussed careful return precautions with patient and family, they advised they have been able to secure a follow-up appointment with GI this coming week.     FINAL CLINICAL IMPRESSION(S) / ED DIAGNOSES   Final diagnoses:  Rectal bleeding     Rx / DC Orders   ED Discharge Orders          Ordered    Ambulatory referral to Gastroenterology       Comments: ER follow-up rectal bleeding   03/19/23 1035             Note:  This document was prepared using Dragon voice recognition software and may include unintentional dictation errors.   Sharyn Creamer, MD 03/20/23 1110

## 2023-03-19 NOTE — ED Triage Notes (Signed)
Pt to ED via [facility] from home for c/o rectal bleeding that began this morning. Pt has hx of HTN and CKD, stage 3. BP 178/89

## 2023-06-11 ENCOUNTER — Encounter: Payer: Self-pay | Admitting: Emergency Medicine

## 2023-06-11 ENCOUNTER — Emergency Department
Admission: EM | Admit: 2023-06-11 | Discharge: 2023-06-11 | Disposition: A | Discharge status: Home / Self Care | Payer: Medicare Other | Attending: Emergency Medicine | Admitting: Emergency Medicine

## 2023-06-11 ENCOUNTER — Other Ambulatory Visit: Payer: Self-pay

## 2023-06-11 DIAGNOSIS — K649 Unspecified hemorrhoids: Secondary | ICD-10-CM | POA: Insufficient documentation

## 2023-06-11 DIAGNOSIS — E875 Hyperkalemia: Secondary | ICD-10-CM | POA: Diagnosis not present

## 2023-06-11 DIAGNOSIS — K625 Hemorrhage of anus and rectum: Secondary | ICD-10-CM | POA: Diagnosis present

## 2023-06-11 LAB — CBC WITH DIFFERENTIAL/PLATELET
Abs Immature Granulocytes: 0.03 10*3/uL (ref 0.00–0.07)
Basophils Absolute: 0 10*3/uL (ref 0.0–0.1)
Basophils Relative: 0 %
Eosinophils Absolute: 0 10*3/uL (ref 0.0–0.5)
Eosinophils Relative: 0 %
HCT: 37.7 % — ABNORMAL LOW (ref 39.0–52.0)
Hemoglobin: 12.2 g/dL — ABNORMAL LOW (ref 13.0–17.0)
Immature Granulocytes: 0 %
Lymphocytes Relative: 4 %
Lymphs Abs: 0.3 10*3/uL — ABNORMAL LOW (ref 0.7–4.0)
MCH: 33.2 pg (ref 26.0–34.0)
MCHC: 32.4 g/dL (ref 30.0–36.0)
MCV: 102.7 fL — ABNORMAL HIGH (ref 80.0–100.0)
Monocytes Absolute: 0.8 10*3/uL (ref 0.1–1.0)
Monocytes Relative: 10 %
Neutro Abs: 6.9 10*3/uL (ref 1.7–7.7)
Neutrophils Relative %: 86 %
Platelets: 151 10*3/uL (ref 150–400)
RBC: 3.67 MIL/uL — ABNORMAL LOW (ref 4.22–5.81)
RDW: 12.3 % (ref 11.5–15.5)
WBC: 8 10*3/uL (ref 4.0–10.5)
nRBC: 0 % (ref 0.0–0.2)

## 2023-06-11 LAB — BASIC METABOLIC PANEL
Anion gap: 8 (ref 5–15)
BUN: 30 mg/dL — ABNORMAL HIGH (ref 8–23)
CO2: 24 mmol/L (ref 22–32)
Calcium: 8.7 mg/dL — ABNORMAL LOW (ref 8.9–10.3)
Chloride: 105 mmol/L (ref 98–111)
Creatinine, Ser: 1.98 mg/dL — ABNORMAL HIGH (ref 0.61–1.24)
GFR, Estimated: 31 mL/min — ABNORMAL LOW (ref >60)
Glucose, Bld: 100 mg/dL — ABNORMAL HIGH (ref 70–99)
Potassium: 5.6 mmol/L — ABNORMAL HIGH (ref 3.5–5.1)
Sodium: 137 mmol/L (ref 135–145)

## 2023-06-11 MED ORDER — PATIROMER SORBITEX CALCIUM 8.4 G PO PACK
8.4000 g | PACK | ORAL | Status: DC
  Filled 2023-06-11: qty 1

## 2023-06-11 MED ORDER — PATIROMER SORBITEX CALCIUM 8.4 G PO PACK: 8.4000 g | PACK | Freq: Once | ORAL | 0 refills | Status: AC

## 2023-06-11 NOTE — Discharge Instructions (Signed)
Please seek medical attention for any high fevers, chest pain, shortness of breath, change in behavior, persistent vomiting, bloody stool or any other new or concerning symptoms.  

## 2023-06-11 NOTE — ED Triage Notes (Signed)
Patient to ED via POV for rectal bleeding. Patient was c/o abd pain earlier today- given over the counter meds. Daughter unsure if he had BM but saw bright red drops on the toilet paper. Hx of hemorrhoids. Denies pain.

## 2023-06-11 NOTE — ED Provider Notes (Signed)
[facility] Provider Note    Event Date/Time   First MD Initiated Contact with Patient 06/11/23 2035     (approximate)   History   Rectal Bleeding   HPI  Nathaniel Villanueva is a 89 y.o. male scented to the emergency department today because of concerns for rectal bleeding.  History primarily obtained from family at bedside. States that the patient has had rectal bleeding in the past that was attributed to hemorrhoids.  The patient has not had any MiraLAX over the past week.  He was straining on the toilet today prior to noticing some blood with his stool and drips of blood on the toilet paper.  His most recent stool however he did not notice any blood on the toilet paper.     Physical Exam   Triage Vital Signs: ED Triage Vitals  Encounter Vitals Group     BP 06/11/23 1634 123/73     Systolic BP Percentile --      Diastolic BP Percentile --      Pulse Rate 06/11/23 1634 81     Resp 06/11/23 1634 18     Temp 06/11/23 1634 98.7 F (37.1 C)     Temp Source 06/11/23 1634 Oral     SpO2 06/11/23 1634 100 %     Weight 06/11/23 1635 170 lb (77.1 kg)     Height 06/11/23 1635 5\' 11"  (1.803 m)     Head Circumference --      Peak Flow --      Pain Score 06/11/23 1634 0     Pain Loc --      Pain Education --      Exclude from Growth Chart --     Most recent vital signs: Vitals:   06/11/23 1634  BP: 123/73  Pulse: 81  Resp: 18  Temp: 98.7 F (37.1 C)  SpO2: 100%   General: Awake, alert, oriented. CV:  Good peripheral perfusion.  Resp:  Normal effort.  Abd:  No distention.  Other:  Small non bleeding hemorrhoid   ED Results / Procedures / Treatments   Labs (all labs ordered are listed, but only abnormal results are displayed) Labs Reviewed  CBC WITH DIFFERENTIAL/PLATELET - Abnormal; Notable for the following components:      Result Value   RBC 3.67 (*)    Hemoglobin 12.2 (*)    HCT 37.7 (*)    MCV 102.7 (*)    Lymphs Abs 0.3 (*)    All  other components within normal limits  BASIC METABOLIC PANEL - Abnormal; Notable for the following components:   Potassium 5.6 (*)    Glucose, Bld 100 (*)    BUN 30 (*)    Creatinine, Ser 1.98 (*)    Calcium 8.7 (*)    GFR, Estimated 31 (*)    All other components within normal limits     EKG  None   RADIOLOGY None   PROCEDURES:  Critical Care performed: No  MEDICATIONS ORDERED IN ED: Medications - No data to display   IMPRESSION / MDM / ASSESSMENT AND PLAN / ED COURSE  I reviewed the triage vital signs and the nursing notes.                              Differential diagnosis includes, but is not limited to, hemorrhoid, anal fissure, diverticulitis, avm, neoplasm  Patient's presentation is most consistent with acute presentation  with potential threat to life or bodily function.  Patient presented to the emergency department today because of concerns for rectal bleeding.  Clinical history is consistent with hemorrhoids.  On exam patient has a small hemorrhoid. At this time no significant bleeding. Do think it would be reasonable for patient to follow up as an outpatient. Blood work showed slight hyperkalemia. Will give dose of veltassa.   Unfortunately it was taking a long time for veltassa to be sent from pharmacy. Family requested discharge. Will send one time dose to pharmacy.     FINAL CLINICAL IMPRESSION(S) / ED DIAGNOSES   Final diagnoses:  Hemorrhoids, unspecified hemorrhoid type    Note:  This document was prepared using Dragon voice recognition software and may include unintentional dictation errors.    Phineas Semen, MD 06/12/23 1515

## 2023-06-11 NOTE — ED Provider Triage Note (Signed)
Emergency Medicine Provider Triage Evaluation Note  Nathaniel Villanueva , a 89 y.o. male  was evaluated in triage.  Pt complains of rectal bleeding. Family has noticed small amounts of bright red blood on the toilet paper. Patient reports difficulty with a bowel movement.   Family reports he has a history of hemorrhoids.   Review of Systems  Positive: Rectal bleeding, constipation Negative: Rectal pain  Physical Exam  There were no vitals taken for this visit. Gen:   Awake, no distress   Resp:  Normal effort  MSK:   Moves extremities without difficulty  Other:    Medical Decision Making  Medically screening exam initiated at 4:32 PM.  Appropriate orders placed.  Nathaniel Villanueva was informed that the remainder of the evaluation will be completed by another provider, this initial triage assessment does not replace that evaluation, and the importance of remaining in the ED until their evaluation is complete.     Cameron Ali, PA-C 06/11/23 1635

## 2023-08-03 ENCOUNTER — Ambulatory Visit: Payer: Medicare Other | Admitting: Urology

## 2023-08-09 NOTE — Progress Notes (Signed)
 08/10/2023 1:20 PM   Nathaniel Villanueva 08-Dec-1930 [id number]  Referring provider: [facility] [address] [place],  [place] [address]  Urological history 1. High risk hematuria - former smoker - CT abdomen w/o in 05/2016 which noted suspected small gallstone dependently in gallbladder.  Multiple BILATERAL renal cysts bearing from stable in size to minimally larger since 2015.  Largest cyst is a complicated cyst at the mid LEFT kidney 11.5 x 8.3 x 11.1 cm containing a 10 x 8 mm high attenuation mural nodule which is slightly larger than the 10 x 6 mm seen on the previous exam.  Due to minimal enlargement of this mural nodule since previous exams, recommend characterization of this lesion by MR imaging with and without contrast.  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  MRI in 07/2016 noted renal lesions represent benign Bosniak category 1 and category 2 cysts. The area concerning for nodularity actually represents a small complex cyst and does not enhance at all. No worrisome renal lesion is identified - Cystoscopy in 09/2017 with Dr. Chauncey revealed a hypervascular prostate.   - Non contrast CT in 09/2018 revealed prostatic enlargement measuring 6.2 x 6.2 x 6.2 cm.  Multiple bilateral renal cysts. 1.2 cm hemorrhagic cyst over the lower pole left kidney.   - He was seen at [facility] on 10/06/2018 and urine cytology at that visit was negative.   He was also recommended to start on finasteride .  -no reports of gross heme  2. Bilateral renal cysts - MRI 2018 notes benign Bosniak 1 & 2 cysts  3. BPH with LU TS - PVR's minimal  - managed with finasteride  5 mg daily  Chief Complaint  Patient presents with   Follow-up    HPI: Nathaniel Villanueva is a 89 y.o. male who presents today for his yearly office visit with his daughter, Nathaniel Villanueva.    Previous records reviewed.     I PSS 0/0  PVR 193 mL   He has no  urinary complaints.  Patient denies any modifying or aggravating factors.  Patient denies any recent UTI's, gross hematuria, dysuria or suprapubic/flank pain.  Patient denies any fevers, chills, nausea or vomiting.     IPSS     Row Name 08/10/23 1300         International Prostate Symptom Score   How often have you had the sensation of not emptying your bladder? Not at All     How often have you had to urinate less than every two hours? Not at All     How often have you found you stopped and started again several times when you urinated? Not at All     How often have you found it difficult to postpone urination? Not at All     How often have you had a weak urinary stream? Not at All     How often have you had to strain to start urination? Not at All     How many times did you typically get up at night to urinate? None     Total IPSS Score 0       Quality of Life due to urinary symptoms   If you were to spend the rest of your life with your urinary condition just the way it is now how would you feel about that? Delighted              Score:  1-7 Mild 8-19 Moderate 20-35 Severe    PMH: Past Medical History:  Diagnosis Date   Anemia    Diabetes mellitus without complication (HCC)    Dysrhythmia    Hyperlipidemia    Hypertension    Hypoglycemia     Surgical History: Past Surgical History:  Procedure Laterality Date   COLONOSCOPY WITH PROPOFOL  N/A 03/04/2015   Procedure: COLONOSCOPY WITH PROPOFOL ;  Surgeon: Gladis RAYMOND Mariner, MD;  Location[facility] ENDOSCOPY;  Service: Endoscopy;  Laterality: N/A;   ESOPHAGOGASTRODUODENOSCOPY N/A 07/10/2020   Procedure: ESOPHAGOGASTRODUODENOSCOPY (EGD);  Surgeon: Toledo, Ladell POUR, MD;  Location: [facility] ENDOSCOPY;  Service: Gastroenterology;  Laterality: N/A;   ESOPHAGOGASTRODUODENOSCOPY (EGD) WITH PROPOFOL  N/A 03/04/2015   Procedure: ESOPHAGOGASTRODUODENOSCOPY (EGD) WITH PROPOFOL ;  Surgeon: Gladis RAYMOND Mariner, MD;  Location: [facility] ENDOSCOPY;   Service: Endoscopy;  Laterality: N/A;   SKIN CANCER EXCISION      Home Medications:  Allergies as of 08/10/2023       Reactions   Lisinopril  Other (See Comments), Swelling   Angioedema:  Per pt's office visit (wellness check) on 11/29.  MD changed BP med to Losartan as a result of allergy to Lisinopril  Other reaction(s): Other (See Comments) Angioedema:  Per pt's office visit (wellness check) on 11/29.  MD changed BP med to Losartan as a result of allergy to Lisinopril  Swollen uvula and tonsils   Metoprolol          Medication List        Accurate as of August 10, 2023  1:20 PM. If you have any questions, ask your nurse or doctor.          amLODipine  10 MG tablet Commonly known as: NORVASC  Take 10 mg by mouth daily.   doxazosin  4 MG tablet Commonly known as: CARDURA  Take 1 tablet (4 mg total) by mouth daily.   Farxiga 10 MG Tabs tablet Generic drug: dapagliflozin propanediol Take 10 mg by mouth every morning.   finasteride  5 MG tablet Commonly known as: PROSCAR  Take 1 tablet (5 mg total) by mouth daily.   losartan 50 MG tablet Commonly known as: COZAAR Take 50 mg by mouth daily.   Xarelto 15 MG Tabs tablet Generic drug: Rivaroxaban        Allergies:  Allergies  Allergen Reactions   Lisinopril  Other (See Comments) and Swelling    Angioedema:  Per pt's office visit (wellness check) on 11/29.  MD changed BP med to Losartan as a result of allergy to Lisinopril  Other reaction(s): Other (See Comments) Angioedema:  Per pt's office visit (wellness check) on 11/29.  MD changed BP med to Losartan as a result of allergy to Lisinopril  Swollen uvula and tonsils   Metoprolol      Family History: Family History  Problem Relation Age of Onset   Hypertension Mother    Prostate cancer Neg Hx    Bladder Cancer Neg Hx    Kidney cancer Neg Hx     Social History:  reports that he has quit smoking. He has never used smokeless tobacco. He reports that he does  not drink alcohol and does not use drugs.  For pertinent review of systems please refer to history of present illness  Physical Exam: BP 131/61   Pulse 65   Constitutional:  Well nourished. Alert and oriented, No acute distress. HEENT: [place] AT, moist mucus membranes.  Trachea midline, no masses. Cardiovascular: No clubbing, cyanosis, or edema. Respiratory: Normal respiratory effort, no increased work of breathing. Neurologic: Grossly intact, no focal deficits, moving  all 4 extremities. Psychiatric: Normal mood and affect.   Laboratory Data: CBC and Differential Order: [id number] Component Ref Range & Units 1 mo ago  WBC 3.8 - 10.8 Thousand/uL 3.3 Low   RBC 4.20 - 5.80 Million/uL 3.41 Low   Hemoglobin 13.2 - 17.1 g/dL 88.7 Low   Hematocrit 61.4 - 50.0 % 34.4 Low   MCV 80.0 - 100.0 fL 100.9 High   MCH 27.0 - 33.0 pg 32.8  MCHC 32.0 - 36.0 g/dL 67.3  Comment:      For adults, a slight decrease in the calculated MCHC      value (in the range of 30 to 32 g/dL) is most likely      not clinically significant; however, it should be      interpreted with caution in correlation with other      red cell parameters and the patient's clinical      condition.  RDW 11.0 - 15.0 % 11.9  Platelets 140 - 400 Thousand/uL 184  MPV 7.5 - 12.5 fL 10.9  Neutrophils Absolute 1500 - 7800 cells/uL 2,234  Band Neutrophils Absolute, Manual Count 0 - 750 cells/uL CANCELED  Comment: Result canceled by the ancillary.  Metamyelocytes Absolute 0 cells/uL CANCELED  Comment: Result canceled by the ancillary.  Absolute Myelocytes 0 cells/uL CANCELED  Comment: Result canceled by the ancillary.  Absolute Promyelocytes 0 cells/uL CANCELED  Comment: Result canceled by the ancillary.  Lymphocytes Absolute 850 - 3900 cells/uL 604 Low   Monocytes Absolute 200 - 950 cells/uL 403  Eosinophils Absolute 15 - 500 cells/uL 50  Basophils Absolute 0 - 200 cells/uL 10  Blasts Absolute 0 cells/uL CANCELED   Comment: Result canceled by the ancillary.  NRBC Absolute 0 cells/uL CANCELED  Comment: Result canceled by the ancillary.  Neutrophils Relative % 67.7  Bands Absolute % CANCELED  Comment: Result canceled by the ancillary.  Metamyelocytes Percent % CANCELED  Comment: Result canceled by the ancillary.  Myelocytes Relative % CANCELED  Comment: Result canceled by the ancillary.  Promyelocytes Relative % CANCELED  Comment: Result canceled by the ancillary.  Lymphocytes % 18.3  Variant lymphocytes/100 WBC (Bld) 0 - 10 % CANCELED  Comment: Result canceled by the ancillary.  Monocytes % 12.2  Eosinophils % 1.5  Basophils Relative % 0.3  Blasts % CANCELED  Comment: Result canceled by the ancillary.  nRBC 0 /100 WBC CANCELED  Comment: Result canceled by the ancillary.  Comment(s) CANCELED  Comment: Result canceled by the ancillary.  Resulting Agency See order comments  Narrative Performed by [facility] PATIENT UNABLE TO VOID; ADVISED TO RETURN FOR COLLECTION.  Specimen Collected: 06/23/23 15:34   Performed by: ORLIN SPIEGEL Last Resulted: 06/24/23 10:13  Received From: [facility]  Result Received: 07/01/23 09:51   Renal Function Panel Order: [id number] Component Ref Range & Units 1 mo ago  Glucose 65 - 99 mg/dL 94  Comment:                                                                                             Fasting reference interval  BUN 7 - 25 mg/dL 44 High   Creatinine 9.29 - 1.22 mg/dL 2.1 High   eGFR CKD-EPI CR 2021 > OR = 60 mL/min/1.25m2 29 Low   BUN/Creatinine Ratio 6 - 22 (calc) 21  Sodium 135 - 146 mmol/L 143  Potassium 3.5 - 5.3 mmol/L 5.3  Chloride 98 - 110 mmol/L 109  Bicarbonate (CO2) 20 - 32 mmol/L 27  Calcium  8.6 - 10.3 mg/dL 9.3  Phosphorus 2.1 - 4.3 mg/dL 3.7  Albumin 3.6 - 5.1 g/dL 3.9  Resulting Agency See order comments  Narrative Performed by [facility] PATIENT  UNABLE TO VOID; ADVISED TO RETURN FOR COLLECTION.  Specimen Collected: 06/23/23 15:34   Performed by: ORLIN SPIEGEL Last Resulted: 06/24/23 10:13  Received From: [facility]  Result Received: 07/01/23 09:51  I have reviewed the labs.    Pertinent Imaging: N/A  Assessment & Plan:    1. BPH with LUTS -continue conservative management, avoiding bladder irritants and timed voiding's -Continue doxazosin  4 mg daily and finasteride  5 mg daily  2. High risk hematuria -previous works up negative -no reports of gross heme   Return in about 1 year (around 08/09/2024) for I PSS, PVR .  These notes generated with voice recognition software. I apologize for typographical errors.  Nathaniel Villanueva  [facility] [address]  [address] [place], [place] [address] [phone]

## 2023-08-10 ENCOUNTER — Encounter: Payer: Self-pay | Admitting: Urology

## 2023-08-10 ENCOUNTER — Ambulatory Visit (INDEPENDENT_AMBULATORY_CARE_PROVIDER_SITE_OTHER): Payer: Medicare Other | Admitting: Urology

## 2023-08-10 VITALS — BP 131/61 | HR 65

## 2023-08-10 DIAGNOSIS — Z87898 Personal history of other specified conditions: Secondary | ICD-10-CM | POA: Diagnosis not present

## 2023-08-10 DIAGNOSIS — N401 Enlarged prostate with lower urinary tract symptoms: Secondary | ICD-10-CM

## 2023-08-10 DIAGNOSIS — R319 Hematuria, unspecified: Secondary | ICD-10-CM

## 2023-08-10 LAB — BLADDER SCAN AMB NON-IMAGING: Scan Result: 193

## 2023-08-10 MED ORDER — DOXAZOSIN MESYLATE 4 MG PO TABS
4.0000 mg | ORAL_TABLET | Freq: Every day | ORAL | 3 refills | Status: AC
Start: 2023-08-10 — End: ?

## 2023-08-10 MED ORDER — FINASTERIDE 5 MG PO TABS
5.0000 mg | ORAL_TABLET | Freq: Every day | ORAL | 3 refills | Status: AC
Start: 2023-08-10 — End: ?

## 2024-06-22 ENCOUNTER — Encounter: Payer: Self-pay | Admitting: Urology

## 2024-08-07 ENCOUNTER — Ambulatory Visit: Payer: Medicare Other | Admitting: Urology

## 2024-08-07 DIAGNOSIS — N401 Enlarged prostate with lower urinary tract symptoms: Secondary | ICD-10-CM

## 2024-08-17 ENCOUNTER — Ambulatory Visit: Admitting: Urology
